# Patient Record
Sex: Female | Born: 1955
Health system: Southern US, Community
[De-identification: ages and names within clinical notes are randomized; demographics above are authoritative.]

## PROBLEM LIST (undated history)

## (undated) DIAGNOSIS — M8580 Other specified disorders of bone density and structure, unspecified site: Secondary | ICD-10-CM

## (undated) DIAGNOSIS — E785 Hyperlipidemia, unspecified: Secondary | ICD-10-CM

## (undated) DIAGNOSIS — C50919 Malignant neoplasm of unspecified site of unspecified female breast: Secondary | ICD-10-CM

## (undated) DIAGNOSIS — E119 Type 2 diabetes mellitus without complications: Secondary | ICD-10-CM

## (undated) DIAGNOSIS — I10 Essential (primary) hypertension: Secondary | ICD-10-CM

## (undated) DIAGNOSIS — K219 Gastro-esophageal reflux disease without esophagitis: Secondary | ICD-10-CM

## (undated) HISTORY — DX: Gastro-esophageal reflux disease without esophagitis: K21.9

## (undated) HISTORY — DX: Other specified disorders of bone density and structure, unspecified site: M85.80

## (undated) HISTORY — DX: Malignant neoplasm of unspecified site of unspecified female breast: C50.919

## (undated) HISTORY — DX: Type 2 diabetes mellitus without complications: E11.9

## (undated) HISTORY — DX: Hyperlipidemia, unspecified: E78.5

## (undated) HISTORY — DX: Essential (primary) hypertension: I10

## (undated) HISTORY — PX: BREAST LUMPECTOMY WITH AXILLARY LYMPH NODE BIOPSY: SHX5593

---

## 1998-05-04 ENCOUNTER — Other Ambulatory Visit: Admission: RE | Admit: 1998-05-04 | Discharge: 1998-05-04 | Payer: Self-pay | Admitting: Obstetrics and Gynecology

## 1998-08-22 HISTORY — PX: DILATION AND CURETTAGE OF UTERUS: SHX78

## 1998-08-24 ENCOUNTER — Ambulatory Visit (HOSPITAL_COMMUNITY): Admission: RE | Admit: 1998-08-24 | Discharge: 1998-08-24 | Payer: Self-pay | Admitting: Obstetrics and Gynecology

## 1998-11-21 HISTORY — PX: ABDOMINAL HYSTERECTOMY: SHX81

## 1998-12-03 ENCOUNTER — Inpatient Hospital Stay (HOSPITAL_COMMUNITY): Admission: RE | Admit: 1998-12-03 | Discharge: 1998-12-06 | Payer: Self-pay | Admitting: Obstetrics and Gynecology

## 2000-01-14 ENCOUNTER — Other Ambulatory Visit: Admission: RE | Admit: 2000-01-14 | Discharge: 2000-01-14 | Payer: Self-pay | Admitting: Obstetrics and Gynecology

## 2000-02-24 ENCOUNTER — Encounter: Admission: RE | Admit: 2000-02-24 | Discharge: 2000-05-24 | Payer: Self-pay | Admitting: General Practice

## 2000-08-05 ENCOUNTER — Encounter: Admission: RE | Admit: 2000-08-05 | Discharge: 2000-11-03 | Payer: Self-pay | Admitting: General Practice

## 2000-11-25 ENCOUNTER — Encounter: Admission: RE | Admit: 2000-11-25 | Discharge: 2001-02-23 | Payer: Self-pay | Admitting: General Practice

## 2001-02-05 ENCOUNTER — Other Ambulatory Visit: Admission: RE | Admit: 2001-02-05 | Discharge: 2001-02-05 | Payer: Self-pay | Admitting: Obstetrics and Gynecology

## 2002-03-08 ENCOUNTER — Other Ambulatory Visit: Admission: RE | Admit: 2002-03-08 | Discharge: 2002-03-08 | Payer: Self-pay | Admitting: Obstetrics and Gynecology

## 2003-04-19 ENCOUNTER — Other Ambulatory Visit: Admission: RE | Admit: 2003-04-19 | Discharge: 2003-04-19 | Payer: Self-pay | Admitting: Obstetrics and Gynecology

## 2008-10-22 DIAGNOSIS — C50919 Malignant neoplasm of unspecified site of unspecified female breast: Secondary | ICD-10-CM

## 2008-10-22 HISTORY — DX: Malignant neoplasm of unspecified site of unspecified female breast: C50.919

## 2008-11-03 ENCOUNTER — Encounter: Admission: RE | Admit: 2008-11-03 | Discharge: 2008-11-03 | Payer: Self-pay | Admitting: Obstetrics and Gynecology

## 2008-11-03 ENCOUNTER — Encounter (INDEPENDENT_AMBULATORY_CARE_PROVIDER_SITE_OTHER): Payer: Self-pay | Admitting: Diagnostic Radiology

## 2008-11-13 ENCOUNTER — Encounter: Admission: RE | Admit: 2008-11-13 | Discharge: 2008-11-13 | Payer: Self-pay | Admitting: Obstetrics and Gynecology

## 2008-12-11 ENCOUNTER — Encounter: Admission: RE | Admit: 2008-12-11 | Discharge: 2008-12-11 | Payer: Self-pay | Admitting: General Surgery

## 2008-12-12 ENCOUNTER — Encounter (INDEPENDENT_AMBULATORY_CARE_PROVIDER_SITE_OTHER): Payer: Self-pay | Admitting: General Surgery

## 2008-12-12 ENCOUNTER — Ambulatory Visit (HOSPITAL_BASED_OUTPATIENT_CLINIC_OR_DEPARTMENT_OTHER): Admission: RE | Admit: 2008-12-12 | Discharge: 2008-12-12 | Payer: Self-pay | Admitting: General Surgery

## 2008-12-12 ENCOUNTER — Encounter: Admission: RE | Admit: 2008-12-12 | Discharge: 2008-12-12 | Payer: Self-pay | Admitting: General Surgery

## 2009-01-12 ENCOUNTER — Encounter (INDEPENDENT_AMBULATORY_CARE_PROVIDER_SITE_OTHER): Payer: Self-pay | Admitting: General Surgery

## 2009-01-12 ENCOUNTER — Ambulatory Visit (HOSPITAL_BASED_OUTPATIENT_CLINIC_OR_DEPARTMENT_OTHER): Admission: RE | Admit: 2009-01-12 | Discharge: 2009-01-12 | Payer: Self-pay | Admitting: General Surgery

## 2009-01-24 ENCOUNTER — Ambulatory Visit: Payer: Self-pay | Admitting: Oncology

## 2009-02-05 ENCOUNTER — Encounter (INDEPENDENT_AMBULATORY_CARE_PROVIDER_SITE_OTHER): Payer: Self-pay | Admitting: *Deleted

## 2009-02-07 ENCOUNTER — Ambulatory Visit: Admission: RE | Admit: 2009-02-07 | Discharge: 2009-04-22 | Payer: Self-pay | Admitting: Radiation Oncology

## 2009-02-07 LAB — CBC WITH DIFFERENTIAL/PLATELET
EOS%: 1.7 % (ref 0.0–7.0)
Eosinophils Absolute: 0.1 10*3/uL (ref 0.0–0.5)
LYMPH%: 30.4 % (ref 14.0–48.0)
MCH: 28.4 pg (ref 26.0–34.0)
MCV: 84.4 fL (ref 81.0–101.0)
MONO%: 6.9 % (ref 0.0–13.0)
NEUT#: 3.5 10*3/uL (ref 1.5–6.5)
Platelets: 258 10*3/uL (ref 145–400)
RBC: 4.16 10*6/uL (ref 3.70–5.32)
RDW: 13.6 % (ref 11.3–14.5)

## 2009-02-08 LAB — COMPREHENSIVE METABOLIC PANEL
ALT: 26 U/L (ref 0–35)
AST: 19 U/L (ref 0–37)
Alkaline Phosphatase: 85 U/L (ref 39–117)
BUN: 24 mg/dL — ABNORMAL HIGH (ref 6–23)
Creatinine, Ser: 0.77 mg/dL (ref 0.40–1.20)

## 2009-02-08 LAB — VITAMIN D 25 HYDROXY (VIT D DEFICIENCY, FRACTURES): Vit D, 25-Hydroxy: 39 ng/mL (ref 30–89)

## 2009-02-23 ENCOUNTER — Ambulatory Visit (HOSPITAL_COMMUNITY): Admission: RE | Admit: 2009-02-23 | Discharge: 2009-02-23 | Payer: Self-pay | Admitting: Oncology

## 2009-04-18 ENCOUNTER — Ambulatory Visit: Payer: Self-pay | Admitting: Oncology

## 2009-04-20 LAB — CBC WITH DIFFERENTIAL/PLATELET
BASO%: 0.7 % (ref 0.0–2.0)
EOS%: 2.4 % (ref 0.0–7.0)
Eosinophils Absolute: 0.1 10*3/uL (ref 0.0–0.5)
LYMPH%: 27.4 % (ref 14.0–49.7)
MCHC: 33.5 g/dL (ref 31.5–36.0)
MCV: 84.4 fL (ref 79.5–101.0)
MONO%: 10.1 % (ref 0.0–14.0)
NEUT#: 2.2 10*3/uL (ref 1.5–6.5)
Platelets: 201 10*3/uL (ref 145–400)
RBC: 4.23 10*6/uL (ref 3.70–5.45)
RDW: 14.4 % (ref 11.2–14.5)

## 2009-04-20 LAB — COMPREHENSIVE METABOLIC PANEL
ALT: 42 U/L — ABNORMAL HIGH (ref 0–35)
AST: 26 U/L (ref 0–37)
Albumin: 4 g/dL (ref 3.5–5.2)
Alkaline Phosphatase: 73 U/L (ref 39–117)
Glucose, Bld: 224 mg/dL — ABNORMAL HIGH (ref 70–99)
Potassium: 4.5 mEq/L (ref 3.5–5.3)
Sodium: 140 mEq/L (ref 135–145)
Total Bilirubin: 0.4 mg/dL (ref 0.3–1.2)
Total Protein: 6.8 g/dL (ref 6.0–8.3)

## 2009-07-04 ENCOUNTER — Ambulatory Visit: Payer: Self-pay | Admitting: Oncology

## 2009-07-06 LAB — CBC WITH DIFFERENTIAL/PLATELET
BASO%: 0.8 % (ref 0.0–2.0)
Basophils Absolute: 0 10*3/uL (ref 0.0–0.1)
EOS%: 2.1 % (ref 0.0–7.0)
HCT: 35.5 % (ref 34.8–46.6)
HGB: 11.9 g/dL (ref 11.6–15.9)
LYMPH%: 32.4 % (ref 14.0–49.7)
MCH: 28.7 pg (ref 25.1–34.0)
MCHC: 33.5 g/dL (ref 31.5–36.0)
NEUT%: 55 % (ref 38.4–76.8)
Platelets: 217 10*3/uL (ref 145–400)
lymph#: 1.3 10*3/uL (ref 0.9–3.3)

## 2009-07-09 LAB — COMPREHENSIVE METABOLIC PANEL
ALT: 28 U/L (ref 0–35)
AST: 21 U/L (ref 0–37)
BUN: 16 mg/dL (ref 6–23)
CO2: 23 mEq/L (ref 19–32)
Calcium: 9.1 mg/dL (ref 8.4–10.5)
Chloride: 104 mEq/L (ref 96–112)
Creatinine, Ser: 0.67 mg/dL (ref 0.40–1.20)
Total Bilirubin: 0.5 mg/dL (ref 0.3–1.2)

## 2009-07-09 LAB — CANCER ANTIGEN 27.29: CA 27.29: 15 U/mL (ref 0–39)

## 2009-07-09 LAB — LACTATE DEHYDROGENASE: LDH: 160 U/L (ref 94–250)

## 2009-11-12 ENCOUNTER — Ambulatory Visit: Payer: Self-pay | Admitting: Oncology

## 2009-11-16 ENCOUNTER — Encounter: Admission: RE | Admit: 2009-11-16 | Discharge: 2009-11-16 | Payer: Self-pay | Admitting: Oncology

## 2009-11-16 LAB — CBC WITH DIFFERENTIAL/PLATELET
BASO%: 0.4 % (ref 0.0–2.0)
HCT: 35.2 % (ref 34.8–46.6)
LYMPH%: 31.6 % (ref 14.0–49.7)
MCH: 29.6 pg (ref 25.1–34.0)
MCHC: 33.8 g/dL (ref 31.5–36.0)
MONO#: 0.4 10*3/uL (ref 0.1–0.9)
NEUT%: 56.9 % (ref 38.4–76.8)
Platelets: 207 10*3/uL (ref 145–400)
WBC: 4.6 10*3/uL (ref 3.9–10.3)

## 2009-11-17 LAB — COMPREHENSIVE METABOLIC PANEL
ALT: 32 U/L (ref 0–35)
CO2: 22 mEq/L (ref 19–32)
Creatinine, Ser: 0.65 mg/dL (ref 0.40–1.20)
Total Bilirubin: 0.5 mg/dL (ref 0.3–1.2)

## 2010-01-03 ENCOUNTER — Telehealth: Payer: Self-pay | Admitting: Gastroenterology

## 2010-05-16 ENCOUNTER — Ambulatory Visit: Payer: Self-pay | Admitting: Oncology

## 2010-05-17 LAB — COMPREHENSIVE METABOLIC PANEL
ALT: 26 U/L (ref 0–35)
Albumin: 4 g/dL (ref 3.5–5.2)
Alkaline Phosphatase: 66 U/L (ref 39–117)
Glucose, Bld: 178 mg/dL — ABNORMAL HIGH (ref 70–99)
Potassium: 4.4 mEq/L (ref 3.5–5.3)
Sodium: 138 mEq/L (ref 135–145)
Total Protein: 6.6 g/dL (ref 6.0–8.3)

## 2010-05-17 LAB — CBC WITH DIFFERENTIAL/PLATELET
Basophils Absolute: 0.1 10*3/uL (ref 0.0–0.1)
EOS%: 2.5 % (ref 0.0–7.0)
Eosinophils Absolute: 0.1 10*3/uL (ref 0.0–0.5)
HGB: 11.7 g/dL (ref 11.6–15.9)
NEUT#: 2.2 10*3/uL (ref 1.5–6.5)
RDW: 13.4 % (ref 11.2–14.5)
lymph#: 1.7 10*3/uL (ref 0.9–3.3)

## 2010-05-17 LAB — CANCER ANTIGEN 27.29: CA 27.29: 6 U/mL (ref 0–39)

## 2010-05-22 ENCOUNTER — Encounter: Payer: Self-pay | Admitting: Gastroenterology

## 2010-11-27 ENCOUNTER — Ambulatory Visit: Payer: Self-pay | Admitting: Oncology

## 2010-11-29 ENCOUNTER — Encounter
Admission: RE | Admit: 2010-11-29 | Discharge: 2010-11-29 | Payer: Self-pay | Source: Home / Self Care | Attending: Oncology | Admitting: Oncology

## 2010-11-29 LAB — CBC WITH DIFFERENTIAL/PLATELET
Basophils Absolute: 0 10*3/uL (ref 0.0–0.1)
EOS%: 2 % (ref 0.0–7.0)
HCT: 35.6 % (ref 34.8–46.6)
HGB: 12.1 g/dL (ref 11.6–15.9)
LYMPH%: 31 % (ref 14.0–49.7)
MCH: 29.5 pg (ref 25.1–34.0)
MCV: 86.7 fL (ref 79.5–101.0)
MONO%: 7.3 % (ref 0.0–14.0)
NEUT%: 59.1 % (ref 38.4–76.8)
Platelets: 227 10*3/uL (ref 145–400)
RDW: 13.5 % (ref 11.2–14.5)

## 2010-11-30 LAB — COMPREHENSIVE METABOLIC PANEL
AST: 18 U/L (ref 0–37)
Albumin: 4.2 g/dL (ref 3.5–5.2)
Alkaline Phosphatase: 71 U/L (ref 39–117)
BUN: 17 mg/dL (ref 6–23)
Calcium: 9 mg/dL (ref 8.4–10.5)
Creatinine, Ser: 0.68 mg/dL (ref 0.40–1.20)
Glucose, Bld: 140 mg/dL — ABNORMAL HIGH (ref 70–99)
Total Bilirubin: 0.6 mg/dL (ref 0.3–1.2)

## 2010-11-30 LAB — LACTATE DEHYDROGENASE: LDH: 148 U/L (ref 94–250)

## 2010-12-13 ENCOUNTER — Encounter: Payer: Self-pay | Admitting: Gastroenterology

## 2011-01-23 NOTE — Progress Notes (Signed)
Summary: Schedule Colonoscopy  Phone Note Outgoing Call Call back at [facility] Phone [phone]   Call placed by: Harlow Mares CMA Duncan Dull),  January 03, 2010 8:37 AM Call placed to: Patient Summary of Call: Left message on patients machine to call back.  Initial call taken by: Harlow Mares CMA Duncan Dull),  January 03, 2010 8:38 AM  Follow-up for Phone Call        Left message on patients machine to call back.  Follow-up by: Harlow Mares CMA Duncan Dull),  January 11, 2010 9:16 AM  Additional Follow-up for Phone Call Additional follow up Details #1::        Left message on patients machine to call back. multiple attempts to contact patient without a return call back.  Additional Follow-up by: Harlow Mares CMA Duncan Dull),  January 17, 2010 9:18 AM

## 2011-01-23 NOTE — Letter (Signed)
[facility]   Imported By: Wilder Glade 06/26/2010 16:18:47  _____________________________________________________________________  External Attachment:    Type:   Image     Comment:   External Document

## 2011-02-12 NOTE — Letter (Signed)
Summary: [facility]  [facility]   Imported By: Sherian Rein 02/06/2011 15:01:46  _____________________________________________________________________  External Attachment:    Type:   Image     Comment:   External Document

## 2011-04-07 LAB — BASIC METABOLIC PANEL
BUN: 14 mg/dL (ref 6–23)
CO2: 26 mEq/L (ref 19–32)
Calcium: 9 mg/dL (ref 8.4–10.5)
Creatinine, Ser: 0.58 mg/dL (ref 0.4–1.2)
Glucose, Bld: 155 mg/dL — ABNORMAL HIGH (ref 70–99)
Sodium: 137 mEq/L (ref 135–145)

## 2011-05-06 NOTE — Op Note (Signed)
Mary [name], Mary [name]               ACCOUNT NO.:  [id number]   MEDICAL RECORD NO.:  [id number]          PATIENT TYPE:  AMB   LOCATION:  DSC                          FACILITY:  [facility]   PHYSICIAN:  Ollen Gross. Vernell Morgans, M.D. DATE OF BIRTH:  Nov 28, 1956   DATE OF PROCEDURE:  01/12/2009  DATE OF DISCHARGE:  01/12/2009                               OPERATIVE REPORT   PREOPERATIVE DIAGNOSIS:  Left breast cancer.   POSTOPERATIVE DIAGNOSIS:  Left breast cancer.   PROCEDURE:  Left left sentinel lymph node biopsy with injection of blue  dye.   SURGEON:  Ollen Gross. Vernell Morgans, MD   ANESTHESIA:  General.   PROCEDURE:  After informed consent was obtained, the patient was brought  to the operating and placed in the supine position on the operating  table.  After adequate induction of general anesthesia, the patient's  left breast and axilla were prepped with Betadine and draped in the  usual sterile manner.  Earlier in the day, the patient had undergone  injection of 1 mCi of technetium sulfur colloid in the subareolar  position.  At this point, we also injected 2 mL of methylene blue and 3  mL of injectable saline also in the subareolar position.  The breast was  massaged for about 5 minutes using the NeoProbe.  A hot spot in the left  axilla was identified.  A small transverse incision was made with a 15  blade knife overlying this hot spot.  This incision was carried down  through the skin and subcutaneous tissue sharply with electrocautery  until the axilla was entered using the NeoProbe to direct dissection.  Blunt hemostat dissection was carried out until we were able to identify  3 hot blue lymph nodes.  The ex vivo count on the first was  approximately 1200.  The second and third ex vivo counts were around 300-  400.  Each of these was dissected out by sharp Bovie dissection.  Each  was sent for touch preps which were negative for any tumor cells.  Once  this was accomplished, the wound was  examined and found to be  hemostatic.  No other hot spots, palpable lymph nodes, or blue lymph  nodes were identified.  At this point, the deep layer of the wound was  closed with interrupted 3-0 Vicryl stitches and the skin was closed with  a running 4-0 Monocryl subcuticular stitch.  Dermabond dressing was  applied.  The patient tolerated the procedure well.  At the end of the  case, all needle, sponge, and instrument counts were correct.  The  patient was then awoke and taken to the recovery room in stable  condition.      Ollen Gross. Vernell Morgans, M.D.  Electronically Signed     PST/MEDQ  D:  01/15/2009  T:  01/15/2009  Job:  [id number]

## 2011-05-06 NOTE — Op Note (Signed)
Mary [name], Mary [name]               ACCOUNT NO.:  [id number]   MEDICAL RECORD NO.:  [id number]          PATIENT TYPE:  AMB   LOCATION:  DSC                          FACILITY:  [facility]   PHYSICIAN:  Ollen Gross. Vernell Morgans, M.D. DATE OF BIRTH:  August 05, 1956   DATE OF PROCEDURE:  12/12/2008  DATE OF DISCHARGE:                               OPERATIVE REPORT   PREOPERATIVE DIAGNOSIS:  Left breast ductal carcinoma in situ.   POSTOPERATIVE DIAGNOSIS:  Left breast ductal carcinoma in situ.   PROCEDURE:  Left breast needle-localized lumpectomy.   SURGEON:  Ollen Gross. Vernell Morgans, MD   ANESTHESIA:  General via LMA.   PROCEDURE:  After informed consent was obtained, the patient was brought  to the operating room, placed in supine position on the operating table.  After adequate induction of general anesthesia, the patient's left  breast was prepped with Betadine and draped in usual sterile manner.  Earlier in the day, the patient had undergone a wire localization  procedure and the patient had 2 wires entering the left breast in the 6  o'clock position.  The calcifications and area of DCIS were very shallow  and small and these wires bracketed the area.  A small radial incision  was made between the 2 wires with a #15 blade knife.  This incision was  carried down through the skin and then to the subcutaneous tissue  sharply with the electrocautery.  At this point, the skin and  subcutaneous tissue was undermined circumferentially beyond the entry  point of the wires.  A circular portion of breast tissue was then taken  out deep to the entry site of the wires to incorporate the bracketed  area.  This was all done sharply with the electrocautery.  Once the  specimen was removed, it was marked with the paint kit according to the  assigned colors, and then a specimen radiograph of the specimen showed  the calcifications and clipped to be in the center of the specimen.  It  was then sent to pathology for  further evaluation.  Hemostasis was  achieved using the Bovie electrocautery.  The wound was irrigated with  copious amounts of saline.  The wound was infiltrated with 0.25%  Marcaine.  The incision was then closed with a running 4-0 Monocryl  subcuticular stitch and a Dermabond dressing was applied.  The patient  tolerated the procedure well.  At the end of the case, all needle,  sponge, and instrument counts were correct.  The patient was then  awakened, taken to recovery room in stable condition.      Ollen Gross. Vernell Morgans, M.D.  Electronically Signed     PST/MEDQ  D:  12/12/2008  T:  12/13/2008  Job:  [id number]

## 2011-06-06 ENCOUNTER — Other Ambulatory Visit: Payer: Self-pay | Admitting: Oncology

## 2011-06-06 ENCOUNTER — Encounter (HOSPITAL_BASED_OUTPATIENT_CLINIC_OR_DEPARTMENT_OTHER): Payer: 59 | Admitting: Oncology

## 2011-06-06 DIAGNOSIS — C50519 Malignant neoplasm of lower-outer quadrant of unspecified female breast: Secondary | ICD-10-CM

## 2011-06-06 LAB — COMPREHENSIVE METABOLIC PANEL
ALT: 22 U/L (ref 0–35)
AST: 19 U/L (ref 0–37)
Albumin: 4.1 g/dL (ref 3.5–5.2)
Alkaline Phosphatase: 65 U/L (ref 39–117)
Potassium: 4.6 mEq/L (ref 3.5–5.3)
Sodium: 142 mEq/L (ref 135–145)
Total Protein: 6.7 g/dL (ref 6.0–8.3)

## 2011-06-06 LAB — CBC WITH DIFFERENTIAL/PLATELET
BASO%: 0.7 % (ref 0.0–2.0)
HCT: 33.7 % — ABNORMAL LOW (ref 34.8–46.6)
LYMPH%: 33.9 % (ref 14.0–49.7)
MCHC: 32.7 g/dL (ref 31.5–36.0)
MONO#: 0.3 10*3/uL (ref 0.1–0.9)
NEUT%: 55.6 % (ref 38.4–76.8)
Platelets: 211 10*3/uL (ref 145–400)
WBC: 4.1 10*3/uL (ref 3.9–10.3)

## 2011-06-13 ENCOUNTER — Other Ambulatory Visit: Payer: Self-pay | Admitting: Oncology

## 2011-06-13 ENCOUNTER — Encounter (HOSPITAL_BASED_OUTPATIENT_CLINIC_OR_DEPARTMENT_OTHER): Payer: 59 | Admitting: Oncology

## 2011-06-13 DIAGNOSIS — Z9889 Other specified postprocedural states: Secondary | ICD-10-CM

## 2011-09-26 LAB — COMPREHENSIVE METABOLIC PANEL
AST: 23 U/L (ref 0–37)
BUN: 15 mg/dL (ref 6–23)
CO2: 27 mEq/L (ref 19–32)
Calcium: 9.1 mg/dL (ref 8.4–10.5)
Chloride: 105 mEq/L (ref 96–112)
Creatinine, Ser: 0.55 mg/dL (ref 0.4–1.2)
GFR calc non Af Amer: >60 mL/min (ref >60)
Glucose, Bld: 141 mg/dL — ABNORMAL HIGH (ref 70–99)
Total Bilirubin: 1 mg/dL (ref 0.3–1.2)

## 2011-09-26 LAB — CBC
HCT: 36.7 % (ref 36.0–46.0)
Hemoglobin: 12.1 g/dL (ref 12.0–15.0)
MCV: 86.3 fL (ref 78.0–100.0)
RBC: 4.25 MIL/uL (ref 3.87–5.11)
WBC: 4.9 10*3/uL (ref 4.0–10.5)

## 2011-09-26 LAB — DIFFERENTIAL
Basophils Absolute: 0 10*3/uL (ref 0.0–0.1)
Basophils Relative: 1 % (ref 0–1)
Eosinophils Absolute: 0.1 10*3/uL (ref 0.0–0.7)
Eosinophils Relative: 3 % (ref 0–5)
Lymphocytes Relative: 40 % (ref 12–46)
Lymphs Abs: 2 10*3/uL (ref 0.7–4.0)
Monocytes Absolute: 0.4 10*3/uL (ref 0.1–1.0)
Monocytes Relative: 9 % (ref 3–12)
Neutro Abs: 2.4 10*3/uL (ref 1.7–7.7)
Neutrophils Relative %: 48 % (ref 43–77)

## 2011-09-26 LAB — GLUCOSE, CAPILLARY
Glucose-Capillary: 112 mg/dL — ABNORMAL HIGH (ref 70–99)
Glucose-Capillary: 147 mg/dL — ABNORMAL HIGH (ref 70–99)

## 2011-11-21 ENCOUNTER — Telehealth: Payer: Self-pay | Admitting: Oncology

## 2011-11-21 NOTE — Telephone Encounter (Signed)
pt called and scheduled her appts for [id number]

## 2011-12-05 ENCOUNTER — Ambulatory Visit
Admission: RE | Admit: 2011-12-05 | Discharge: 2011-12-05 | Disposition: A | Discharge status: Home / Self Care | Payer: 59 | Source: Ambulatory Visit | Attending: Oncology | Admitting: Oncology

## 2011-12-05 DIAGNOSIS — Z9889 Other specified postprocedural states: Secondary | ICD-10-CM

## 2012-05-24 ENCOUNTER — Telehealth: Payer: Self-pay | Admitting: Oncology

## 2012-05-24 NOTE — Telephone Encounter (Signed)
Changed 6/7 lab appt time to 9 am due to lb meeting. lmonvm informing pt. Pt asked to call me back if she has any questions or cannot keep this d/t.

## 2012-05-24 NOTE — Telephone Encounter (Signed)
Pt called and changed time for 6/7 lb appt to 11:15 am. Also confirmed 6/14 appt.

## 2012-05-27 ENCOUNTER — Other Ambulatory Visit: Payer: Self-pay | Admitting: *Deleted

## 2012-05-27 DIAGNOSIS — M810 Age-related osteoporosis without current pathological fracture: Secondary | ICD-10-CM

## 2012-05-27 DIAGNOSIS — C50919 Malignant neoplasm of unspecified site of unspecified female breast: Secondary | ICD-10-CM

## 2012-05-28 ENCOUNTER — Other Ambulatory Visit: Payer: Self-pay | Admitting: Oncology

## 2012-05-28 ENCOUNTER — Other Ambulatory Visit: Payer: 59 | Admitting: Lab

## 2012-05-28 ENCOUNTER — Other Ambulatory Visit (HOSPITAL_BASED_OUTPATIENT_CLINIC_OR_DEPARTMENT_OTHER): Payer: 59 | Admitting: Lab

## 2012-05-28 DIAGNOSIS — C50919 Malignant neoplasm of unspecified site of unspecified female breast: Secondary | ICD-10-CM

## 2012-05-28 DIAGNOSIS — M810 Age-related osteoporosis without current pathological fracture: Secondary | ICD-10-CM

## 2012-05-28 DIAGNOSIS — E559 Vitamin D deficiency, unspecified: Secondary | ICD-10-CM

## 2012-05-28 LAB — CBC WITH DIFFERENTIAL/PLATELET
EOS%: 2 % (ref 0.0–7.0)
Eosinophils Absolute: 0.1 10*3/uL (ref 0.0–0.5)
MCV: 88.2 fL (ref 79.5–101.0)
MONO%: 7.2 % (ref 0.0–14.0)
NEUT#: 2.9 10*3/uL (ref 1.5–6.5)
RBC: 4.05 10*6/uL (ref 3.70–5.45)
RDW: 13.4 % (ref 11.2–14.5)
lymph#: 1.5 10*3/uL (ref 0.9–3.3)

## 2012-05-29 LAB — COMPREHENSIVE METABOLIC PANEL
AST: 17 U/L (ref 0–37)
Albumin: 4.1 g/dL (ref 3.5–5.2)
Alkaline Phosphatase: 65 U/L (ref 39–117)
Calcium: 9.2 mg/dL (ref 8.4–10.5)
Chloride: 102 mEq/L (ref 96–112)
Potassium: 4.5 mEq/L (ref 3.5–5.3)
Sodium: 138 mEq/L (ref 135–145)
Total Protein: 6.5 g/dL (ref 6.0–8.3)

## 2012-06-04 ENCOUNTER — Telehealth: Payer: Self-pay | Admitting: Oncology

## 2012-06-04 ENCOUNTER — Other Ambulatory Visit: Payer: Self-pay | Admitting: Oncology

## 2012-06-04 ENCOUNTER — Ambulatory Visit (HOSPITAL_BASED_OUTPATIENT_CLINIC_OR_DEPARTMENT_OTHER): Payer: 59 | Admitting: Oncology

## 2012-06-04 VITALS — BP 105/72 | HR 111 | Temp 98.7°F | Ht 61.5 in | Wt 244.8 lb

## 2012-06-04 DIAGNOSIS — E559 Vitamin D deficiency, unspecified: Secondary | ICD-10-CM

## 2012-06-04 DIAGNOSIS — C50919 Malignant neoplasm of unspecified site of unspecified female breast: Secondary | ICD-10-CM | POA: Insufficient documentation

## 2012-06-04 DIAGNOSIS — Z170 Estrogen receptor positive status [ER+]: Secondary | ICD-10-CM

## 2012-06-04 NOTE — Telephone Encounter (Signed)
called pt and scheduled mammogram on 12/16 @ [facility].  scheduled f/u for 12/27 with lab on 12/20

## 2012-06-04 NOTE — Progress Notes (Signed)
ID: Mary [name]  DOB: 02-16-1956  MR#: [id number]  CSN#: [id number]   Interval History:   56 year old woman with history of T1 N0 ER/PR positive breast cancer status post lumpectomy, 11/23/2008, status post radiation therapy completed 03/30/2009 currently on Femara History of type 2 diabetes  ROS:  Return for followup. Doing well. We laid off from her job and has been unemployed for close to a year. She is otherwise doing well. She has joined a gym. She notes that her diabetic control is improved. She had a mammogram in December as well as a bone density test. The mammogram was normal. The bone density test showed mild osteopenia. Allergies  Allergen Reactions  . Codeine Nausea And Vomiting    Current Outpatient Prescriptions  Medication Sig Dispense Refill  . aspirin 81 MG tablet Take 81 mg by mouth daily.      Marland Kitchen atorvastatin (LIPITOR) 10 MG tablet Take 10 mg by mouth daily.      . Calcium-Magnesium-Vitamin D (CITRACAL CALCIUM+D PO) Take 2 tablets by mouth daily. calcim 630/vitamin d 500      . Cholecalciferol (VITAMIN D3) 2000 UNITS TABS Take 1 tablet by mouth daily.      Marland Kitchen esomeprazole (NEXIUM) 40 MG capsule Take 40 mg by mouth daily before breakfast.      . ferrous sulfate 325 (65 FE) MG tablet Take 325 mg by mouth daily with breakfast.      . glimepiride (AMARYL) 4 MG tablet Take 4 mg by mouth daily before breakfast.      . letrozole (FEMARA) 2.5 MG tablet Take 2.5 mg by mouth daily.      . metFORMIN (GLUCOPHAGE) 500 MG tablet Take 1,000 mg by mouth 2 (two) times daily with a meal.      . Multiple Vitamin (MULTIVITAMIN) tablet Take 1 tablet by mouth daily.      . pioglitazone (ACTOS) 30 MG tablet Take 30 mg by mouth daily.      Marland Kitchen telmisartan-hydrochlorothiazide (MICARDIS HCT) 40-12.5 MG per tablet Take 1 tablet by mouth daily.         Objective:  Filed Vitals:   06/04/12 0852  BP: 105/72  Pulse: 111  Temp: 98.7 F (37.1 C)    BMI: Body mass index is 45.51 kg/(m^2).   ECOG  FS:0  Physical Exam:   Sclerae unicteric  Oropharynx clear  No peripheral adenopathy  Lungs clear -- no rales or rhonchi  Heart regular rate and rhythm  Abdomen benign  MSK no focal spinal tenderness, no peripheral edema  Neuro nonfocal  Breast exam: Both breasts are free of any masses. The left breast is status post lumpectomy and radiation. There is tells ectasia present secondary to radiation. Both axilla negative for adenopathy.  Lab Results:      Chemistry      Component Value Date/Time   NA 138 05/28/2012 1052   K 4.5 05/28/2012 1052   CL 102 05/28/2012 1052   CO2 27 05/28/2012 1052   BUN 16 05/28/2012 1052   CREATININE 0.76 05/28/2012 1052      Component Value Date/Time   CALCIUM 9.2 05/28/2012 1052   ALKPHOS 65 05/28/2012 1052   AST 17 05/28/2012 1052   ALT 19 05/28/2012 1052   BILITOT 0.5 05/28/2012 1052       Lab Results  Component Value Date   WBC 4.9 05/28/2012   HGB 11.8 05/28/2012   HCT 35.8 05/28/2012   MCV 88.2 05/28/2012   PLT 249 05/28/2012  NEUTROABS 2.9 05/28/2012    Studies/Results:  No results found.  Assessment: Node-negative ER/PR positive breast cancer status post lumpectomy radiation on letrozole     Plan: Patient is doing well. She is currently is weight. I discussed her bone density test which showed mild osteopenia. She prefers to increase your vitamin D intake for now. I will see her in 6 months time with appropriate imaging studies.        Mary [name] 06/04/2012

## 2012-07-06 ENCOUNTER — Encounter: Payer: Self-pay | Admitting: Internal Medicine

## 2012-07-12 ENCOUNTER — Other Ambulatory Visit: Payer: Self-pay | Admitting: *Deleted

## 2012-07-12 DIAGNOSIS — E559 Vitamin D deficiency, unspecified: Secondary | ICD-10-CM

## 2012-07-12 DIAGNOSIS — C50919 Malignant neoplasm of unspecified site of unspecified female breast: Secondary | ICD-10-CM

## 2012-07-12 MED ORDER — LETROZOLE 2.5 MG PO TABS: 2.5000 mg | ORAL_TABLET | Freq: Every day | ORAL | Status: DC

## 2012-12-06 ENCOUNTER — Ambulatory Visit
Admission: RE | Admit: 2012-12-06 | Discharge: 2012-12-06 | Disposition: A | Discharge status: Home / Self Care | Payer: 59 | Source: Ambulatory Visit | Attending: Oncology | Admitting: Oncology

## 2012-12-06 DIAGNOSIS — C50919 Malignant neoplasm of unspecified site of unspecified female breast: Secondary | ICD-10-CM

## 2012-12-10 ENCOUNTER — Other Ambulatory Visit (HOSPITAL_BASED_OUTPATIENT_CLINIC_OR_DEPARTMENT_OTHER): Payer: 59 | Admitting: Lab

## 2012-12-10 DIAGNOSIS — E559 Vitamin D deficiency, unspecified: Secondary | ICD-10-CM

## 2012-12-10 DIAGNOSIS — C50919 Malignant neoplasm of unspecified site of unspecified female breast: Secondary | ICD-10-CM

## 2012-12-10 LAB — COMPREHENSIVE METABOLIC PANEL (CC13)
AST: 24 U/L (ref 5–34)
Alkaline Phosphatase: 66 U/L (ref 40–150)
BUN: 19 mg/dL (ref 7.0–26.0)
Calcium: 9.4 mg/dL (ref 8.4–10.4)
Chloride: 103 mEq/L (ref 98–107)
Creatinine: 0.8 mg/dL (ref 0.6–1.1)
Total Bilirubin: 0.67 mg/dL (ref 0.20–1.20)

## 2012-12-10 LAB — CBC WITH DIFFERENTIAL/PLATELET
BASO%: 1.3 % (ref 0.0–2.0)
EOS%: 2.4 % (ref 0.0–7.0)
HCT: 36.4 % (ref 34.8–46.6)
LYMPH%: 33.8 % (ref 14.0–49.7)
MCH: 29.4 pg (ref 25.1–34.0)
MCHC: 33.4 g/dL (ref 31.5–36.0)
MCV: 87.9 fL (ref 79.5–101.0)
MONO%: 9.5 % (ref 0.0–14.0)
NEUT%: 53 % (ref 38.4–76.8)
Platelets: 223 10*3/uL (ref 145–400)

## 2012-12-11 ENCOUNTER — Telehealth: Payer: Self-pay | Admitting: *Deleted

## 2012-12-11 NOTE — Telephone Encounter (Signed)
patient confirmed over the phone the new date and time on 12-13-2012 at 4:00pm

## 2012-12-13 ENCOUNTER — Ambulatory Visit (HOSPITAL_BASED_OUTPATIENT_CLINIC_OR_DEPARTMENT_OTHER): Payer: 59 | Admitting: Oncology

## 2012-12-13 VITALS — BP 110/73 | HR 128 | Temp 98.1°F | Resp 20 | Ht 61.5 in | Wt 239.3 lb

## 2012-12-13 DIAGNOSIS — Z170 Estrogen receptor positive status [ER+]: Secondary | ICD-10-CM

## 2012-12-13 DIAGNOSIS — C50919 Malignant neoplasm of unspecified site of unspecified female breast: Secondary | ICD-10-CM

## 2012-12-13 DIAGNOSIS — M899 Disorder of bone, unspecified: Secondary | ICD-10-CM

## 2012-12-13 NOTE — Progress Notes (Signed)
ID: Mary [name]  DOB: Jun 05, 1956  MR#: [id number]  CSN#: [id number]   Interval History:   55 year old woman with history of T1 N0 ER/PR positive breast cancer status post lumpectomy, 11/23/2008, status post radiation therapy completed 03/30/2009 currently on Femara History of type 2 diabetes; recent A1C was ~7, she has lost 5 lbs.   ROS:  Return for followup. Doing well. We laid off from her job and has been unemployed for close to a year. She is otherwise doing well. She has joined a gym. She notes that her diabetic control is improved. She had a mammogram in December as well as a bone density test. The mammogram was normal. The bone density test showed mild osteopenia.  Allergies  Allergen Reactions  . Codeine Nausea And Vomiting    Current Outpatient Prescriptions  Medication Sig Dispense Refill  . aspirin 81 MG tablet Take 81 mg by mouth daily.      Marland Kitchen atorvastatin (LIPITOR) 10 MG tablet Take 10 mg by mouth daily.      . Calcium-Magnesium-Vitamin D (CITRACAL CALCIUM+D PO) Take 2 tablets by mouth daily. calcim 630/vitamin d 500      . Cholecalciferol (VITAMIN D3) 2000 UNITS TABS Take 1 tablet by mouth daily.      Marland Kitchen esomeprazole (NEXIUM) 40 MG capsule Take 40 mg by mouth daily before breakfast.      . ferrous sulfate 325 (65 FE) MG tablet Take 325 mg by mouth daily with breakfast.      . glimepiride (AMARYL) 4 MG tablet Take 4 mg by mouth daily before breakfast.      . letrozole (FEMARA) 2.5 MG tablet Take 1 tablet (2.5 mg total) by mouth daily.  30 tablet  5  . metFORMIN (GLUCOPHAGE) 500 MG tablet Take 1,000 mg by mouth 2 (two) times daily with a meal.      . Multiple Vitamin (MULTIVITAMIN) tablet Take 1 tablet by mouth daily.      . pioglitazone (ACTOS) 30 MG tablet Take 30 mg by mouth daily.      Marland Kitchen telmisartan-hydrochlorothiazide (MICARDIS HCT) 40-12.5 MG per tablet Take 1 tablet by mouth daily.         Objective:  Filed Vitals:   12/13/12 1557  BP: 110/73  Pulse: 128  Temp:  98.1 F (36.7 C)  Resp: 20    BMI: Body mass index is 44.48 kg/(m^2).   ECOG FS:0  Physical Exam: Obese woman  Sclerae unicteric  Oropharynx clear  No peripheral adenopathy  Lungs clear -- no rales or rhonchi  Heart regular rate and rhythm  Abdomen benign  MSK no focal spinal tenderness, no peripheral edema  Neuro nonfocal  Breast exam: Both breasts are free of any masses. The left breast is status post lumpectomy and radiation. There is tells ectasia present secondary to radiation. Both axilla negative for adenopathy.  Lab Results:      Chemistry      Component Value Date/Time   NA 140 12/10/2012 0754   NA 138 05/28/2012 1052   K 4.1 12/10/2012 0754   K 4.5 05/28/2012 1052   CL 103 12/10/2012 0754   CL 102 05/28/2012 1052   CO2 26 12/10/2012 0754   CO2 27 05/28/2012 1052   BUN 19.0 12/10/2012 0754   BUN 16 05/28/2012 1052   CREATININE 0.8 12/10/2012 0754   CREATININE 0.76 05/28/2012 1052      Component Value Date/Time   CALCIUM 9.4 12/10/2012 0754   CALCIUM 9.2 05/28/2012 1052  ALKPHOS 66 12/10/2012 0754   ALKPHOS 65 05/28/2012 1052   AST 24 12/10/2012 0754   AST 17 05/28/2012 1052   ALT 29 12/10/2012 0754   ALT 19 05/28/2012 1052   BILITOT 0.67 12/10/2012 0754   BILITOT 0.5 05/28/2012 1052       Lab Results  Component Value Date   WBC 4.4 12/10/2012   HGB 12.2 12/10/2012   HCT 36.4 12/10/2012   MCV 87.9 12/10/2012   PLT 223 12/10/2012   NEUTROABS 2.3 12/10/2012    Studies/Results:  No results found.  Assessment:  Node-negative ER/PR positive breast cancer status post lumpectomy /radiation on letrozole , f/u 6 months.   Plan: Patient is doing well. She continues to lose weight,     Mary [name] 12/13/2012

## 2012-12-14 ENCOUNTER — Telehealth: Payer: Self-pay | Admitting: *Deleted

## 2012-12-14 NOTE — Telephone Encounter (Signed)
Gave patient instructions on getting her 2014 calendar

## 2012-12-17 ENCOUNTER — Ambulatory Visit: Payer: 59 | Admitting: Oncology

## 2012-12-17 ENCOUNTER — Other Ambulatory Visit: Payer: 59 | Admitting: Lab

## 2013-01-06 ENCOUNTER — Other Ambulatory Visit: Payer: Self-pay | Admitting: *Deleted

## 2013-01-06 DIAGNOSIS — E559 Vitamin D deficiency, unspecified: Secondary | ICD-10-CM

## 2013-01-06 DIAGNOSIS — C50919 Malignant neoplasm of unspecified site of unspecified female breast: Secondary | ICD-10-CM

## 2013-01-06 MED ORDER — LETROZOLE 2.5 MG PO TABS: 2.5000 mg | ORAL_TABLET | Freq: Every day | ORAL | Status: DC

## 2013-07-19 ENCOUNTER — Telehealth: Payer: Self-pay | Admitting: *Deleted

## 2013-07-19 NOTE — Telephone Encounter (Signed)
Left message for a return phone call to schedule patient with a new provider.  Awaiting patient response.

## 2013-07-22 ENCOUNTER — Encounter: Payer: Self-pay | Admitting: *Deleted

## 2013-07-22 NOTE — Progress Notes (Signed)
Mailed patient letter to contact our office to schedule an appt. With a new provider.

## 2013-07-25 ENCOUNTER — Telehealth: Payer: Self-pay | Admitting: *Deleted

## 2013-07-25 NOTE — Telephone Encounter (Signed)
Received a return phone call from patient and confirmed appt. For 08/08/13 at 10am with Bernell List.  Then will become Dr. Darnelle Catalan.

## 2013-08-08 ENCOUNTER — Encounter: Payer: Self-pay | Admitting: Family

## 2013-08-08 ENCOUNTER — Ambulatory Visit (HOSPITAL_BASED_OUTPATIENT_CLINIC_OR_DEPARTMENT_OTHER): Payer: 59 | Admitting: Lab

## 2013-08-08 ENCOUNTER — Ambulatory Visit (HOSPITAL_BASED_OUTPATIENT_CLINIC_OR_DEPARTMENT_OTHER): Payer: 59 | Admitting: Family

## 2013-08-08 ENCOUNTER — Telehealth: Payer: Self-pay | Admitting: *Deleted

## 2013-08-08 VITALS — BP 105/71 | HR 97 | Temp 98.1°F | Resp 18 | Ht 62.0 in | Wt 221.7 lb

## 2013-08-08 DIAGNOSIS — Z853 Personal history of malignant neoplasm of breast: Secondary | ICD-10-CM

## 2013-08-08 DIAGNOSIS — M899 Disorder of bone, unspecified: Secondary | ICD-10-CM

## 2013-08-08 DIAGNOSIS — Z170 Estrogen receptor positive status [ER+]: Secondary | ICD-10-CM

## 2013-08-08 DIAGNOSIS — C50919 Malignant neoplasm of unspecified site of unspecified female breast: Secondary | ICD-10-CM

## 2013-08-08 LAB — CBC WITH DIFFERENTIAL/PLATELET
Basophils Absolute: 0.1 10*3/uL (ref 0.0–0.1)
EOS%: 1.8 % (ref 0.0–7.0)
HCT: 36.1 % (ref 34.8–46.6)
HGB: 12 g/dL (ref 11.6–15.9)
LYMPH%: 32.6 % (ref 14.0–49.7)
MCH: 28.8 pg (ref 25.1–34.0)
NEUT%: 56.9 % (ref 38.4–76.8)
Platelets: 245 10*3/uL (ref 145–400)
lymph#: 2 10*3/uL (ref 0.9–3.3)

## 2013-08-08 LAB — COMPREHENSIVE METABOLIC PANEL (CC13)
AST: 18 U/L (ref 5–34)
BUN: 15.2 mg/dL (ref 7.0–26.0)
CO2: 27 mEq/L (ref 22–29)
Calcium: 9.7 mg/dL (ref 8.4–10.4)
Chloride: 104 mEq/L (ref 98–109)
Creatinine: 0.8 mg/dL (ref 0.6–1.1)
Total Bilirubin: 0.58 mg/dL (ref 0.20–1.20)

## 2013-08-08 NOTE — Telephone Encounter (Signed)
appts made and printed...td 

## 2013-08-08 NOTE — Progress Notes (Signed)
[facility]  Telephone:([phone] [phone]) [phone]  OFFICE PROGRESS NOTE   ID: Baneen Wieseler   DOB: [birth year]  MR#: [id number]  JYN#:[id number]   PCP: Garlan Fillers, MD GYN: Juluis Mire, M.D. QM:VHQI Vernell Morgans, M.D.,    RAD ONC: Margaretmary Dys, M.D.   HISTORY OF PRESENT ILLNESS: From Dr. Theron Arista Rubin's new patient evaluation note dated 02/07/2009: "This is a pleasant 57 year old woman from [place], referred by Dr. Carolynne Edouard for evaluation and treatment of breast cancer.This woman has been in relatively good health.  She has annual screening mammography.  Mammogram from October 25, 2008 at the [facility] showed calcification in the left breast.  Further images were recommended.  A followup mammogram showed calcifications and a tissue biopsy was recommended.  A biopsy performed on 11/03/2008 showed DCIS, calcifications and necrosis, ER and PR positive at 100% and 60% respectively.  A preoperative MRI scan was performed on 11/13/2008 and this showed a non-mass like rim enhancement in the left breast at 6 o'clock in biopsy cavity.  No other abnormalities were seen.  The patient elected to go on lumpectomy.  A lumpectomy performed on 12/12/2008 showed a focus of DCIS, high grade of calcifications and associated with a focus of invasive ductal cancer, grade 2/3 measuring 0.5 cm.  Lymphovascular invasion was not seen.  Surgical margins were clear.  The invasive component was ER and PR positive at 100% and 94% respectively, proliferative index 22%, and HER-2 was 0. Ms. Mohon has had an unremarkable postoperative course.  She has little bit tenderness in the left axilla."  Her subsequent history is as detailed below.   INTERVAL HISTORY: Dr. Darnelle Catalan and I saw Aslan Montagna today for followup of invasive ductal carcinoma of the left breast.  The patient was last seen by Dr. Donnie Coffin on 12/13/2012.  Since her last office visit, the patient has been doing relatively well.   She is establishing herself with Dr. Darrall Dears service today.  REVIEW OF SYSTEMS: A 10 point review of systems was completed and is negative except the patient states she has lost approximately 20 pounds through diet and exercise.  She denies any other symptomatology and states she feels great.   PAST MEDICAL HISTORY: Past Medical History  Diagnosis Date  . Breast cancer 10/2008    Left breast invasive ductal carcinoma  . Diabetes mellitus without complication   . Hypertension   . GERD (gastroesophageal reflux disease)   . Osteopenia   . Hyperlipidemia     PAST SURGICAL HISTORY: Past Surgical History  Procedure Laterality Date  . Breast lumpectomy with axillary lymph node biopsy Left     Lumpectomy in 11/2008 and axillary biopsy in 12/2008  . Abdominal hysterectomy  11/1998  . Dilation and curettage of uterus  08/1998  Includes hysterectomy with oophorectomy in 1999 status post D&C.   FAMILY HISTORY Family History  Problem Relation Age of Onset  . Heart attack Mother   . Cancer Father     Prostate cancer  . Hypertension Brother   Father is deceased from prostate cancer.  Mother is deceased from a heart attack.   She has no sisters. One brother is alive and well.   GYNECOLOGIC HISTORY: G0, P0, menarche age 27, menopause at the time of hysterectomy, hormone replacement therapy after hysterectomy.  Hormone replacement therapy was discontinued before her breast cancer diagnosis .  SOCIAL HISTORY: The patient was married for 15 years and was divorced in 19.  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  She currently works part-time at [employer] work.  In her spare time she enjoys cooking/baking, and walking her new dog Westly Pam.  ADVANCED DIRECTIVES: Not on file  HEALTH MAINTENANCE: History  Substance Use Topics  . Smoking status: Former Games developer  . Smokeless tobacco: Never Used     Comment: Quit smoking in 1990    . Alcohol Use: No    Colonoscopy:  Not on file PAP:  Not on file Bone density:  The patient's last bone density scan on 12/22/2011 showed a T score of -1.3 (osteopenia). Lipid panel:  Not on file  Allergies  Allergen Reactions  . Codeine Nausea And Vomiting    Current Outpatient Prescriptions  Medication Sig Dispense Refill  . aspirin 81 MG tablet Take 81 mg by mouth daily.      Marland Kitchen atorvastatin (LIPITOR) 10 MG tablet Take 10 mg by mouth daily.      . Calcium-Magnesium-Vitamin D (CITRACAL CALCIUM+D PO) Take 2 tablets by mouth daily. calcim 630/vitamin d 500      . Cholecalciferol (VITAMIN D3) 2000 UNITS TABS Take 1 tablet by mouth 2 (two) times daily.       Marland Kitchen esomeprazole (NEXIUM) 40 MG capsule Take 40 mg by mouth daily before breakfast.      . ferrous sulfate 325 (65 FE) MG tablet Take 325 mg by mouth daily with breakfast.      . glimepiride (AMARYL) 4 MG tablet Take 4 mg by mouth 2 (two) times daily.       Marland Kitchen letrozole (FEMARA) 2.5 MG tablet Take 1 tablet (2.5 mg total) by mouth daily.  30 tablet  6  . metFORMIN (GLUCOPHAGE) 500 MG tablet Take 1,000 mg by mouth 2 (two) times daily with a meal.      . Multiple Vitamin (MULTIVITAMIN) tablet Take 1 tablet by mouth daily.      . pioglitazone (ACTOS) 30 MG tablet Take 30 mg by mouth daily.      Marland Kitchen telmisartan-hydrochlorothiazide (MICARDIS HCT) 40-12.5 MG per tablet Take 1 tablet by mouth daily.       No current facility-administered medications for this visit.    OBJECTIVE: Filed Vitals:   08/08/13 0957  BP: 105/71  Pulse: 97  Temp: 98.1 F (36.7 C)  Resp: 18     Body mass index is 40.54 kg/(m^2).      ECOG FS: 0 - Asymptomatic  General appearance: Alert, cooperative, well nourished, no apparent distress Head: Normocephalic, without obvious abnormality, atraumatic Eyes: Arcus senilis, PERRLA, EOMI Nose: Nares, septum and mucosa are normal, no drainage or sinus tenderness Neck: Supple, symmetrical, trachea midline, excessive  habitus, no tenderness Resp: Clear to auscultation bilaterally Cardio: Regular rate and rhythm, S1, S2 normal, no murmur, click, rub or gallop Breasts: Pendulous bilaterally, left breast has well-healed surgical scars, left breast has surgical architectural  and radiation changes noted, glandular breast tissue bilaterally (left breast > right breast), bilateral axillary fullness GI: Soft, distended, non-tender, hypoactive bowel sounds, excessive habitus Extremities: Extremities normal, atraumatic, no cyanosis or edema Lymph nodes:Supraclavicular nodes normal Neurologic: Grossly normal   LAB RESULTS: Lab Results  Component Value Date   WBC 6.1 08/08/2013   NEUTROABS 3.5 08/08/2013   HGB 12.0 08/08/2013   HCT 36.1 08/08/2013   MCV 87.0 08/08/2013   PLT 245 08/08/2013      Chemistry      Component Value Date/Time   NA 141  08/08/2013 1120   NA 138 05/28/2012 1052   K 4.2 08/08/2013 1120   K 4.5 05/28/2012 1052   CL 103 12/10/2012 0754   CL 102 05/28/2012 1052   CO2 27 08/08/2013 1120   CO2 27 05/28/2012 1052   BUN 15.2 08/08/2013 1120   BUN 16 05/28/2012 1052   CREATININE 0.8 08/08/2013 1120   CREATININE 0.76 05/28/2012 1052      Component Value Date/Time   CALCIUM 9.7 08/08/2013 1120   CALCIUM 9.2 05/28/2012 1052   ALKPHOS 59 08/08/2013 1120   ALKPHOS 65 05/28/2012 1052   AST 18 08/08/2013 1120   AST 17 05/28/2012 1052   ALT 20 08/08/2013 1120   ALT 19 05/28/2012 1052   BILITOT 0.58 08/08/2013 1120   BILITOT 0.5 05/28/2012 1052      Lab Results  Component Value Date   LABCA2 10 05/28/2012    Urinalysis No results found for this basename: colorurine,  appearanceur,  labspec,  phurine,  glucoseu,  hgbur,  bilirubinur,  ketonesur,  proteinur,  urobilinogen,  nitrite,  leukocytesur    STUDIES: 1.  The patient's last bone density scan on 12/22/2011 showed a T score of -1.3 (osteopenia).  2.  The patient's last bilateral digital diagnostic mammogram on 12/06/2012 showed the breast tissue is almost  entirely fatty.  Left lumpectomy changes are present.  There are coarse benign dystrophic calcifications in the left lumpectomy site.  There is no suspicious dominant mass, nonsurgical architectural distortion or calcification to suggest malignancy.  No mammographic evidence of malignancy.   ASSESSMENT: 57 y.o. [place], [place] woman: 1.  The patient had a left breast needle core biopsy at the 6 o'clock position on 11/03/2008 which showed ductal carcinoma in situ with calcifications and necrosis, estrogen receptor 100% positive, progesterone receptor 60% positive.  2.  The patient had a bilateral breast MRI on 11/13/2008 which showed non mass-like rim enhancement of the left breast 6 o'clock biopsy cavity in the area of known DCIS.  The extent of the involvement of DCIS is best approximated by mammographically evident calcifications; bracketing could be considered at the time of preoperative needle localization. No other area of abnormal enhancement was seen in either breast.  No lymphadenopathy or other abnormal T2-weighted hyperintensity was seen.  No MRI evidence for multifocal or multicentric disease (clinical stage 0, PTis).  3.  Status post left breast lumpectomy on 12/12/2008 for a stage I, pT1a, pNX, pMX, 0.5 cm invasive ductal carcinoma, grade 2, and high-grade ductal carcinoma in situ with calcifications and necrosis, the margins were negative for carcinoma, estrogen receptor 100% positive, progesterone receptor 94% positive, Ki-67 22%, HER-2/neu by CISH no amplification with a ratio of 1.10.  4.  Status post left axillary sentinel node biopsy on 01/12/2009 which showed 0/3 metastatic left axillary lymph nodes.  5.  The patient underwent radiation therapy from 02/19/2009 through 03/30/2009.    6.  The patient started antiestrogen therapy with Femara in 03/2009.   7.  Osteopenia   PLAN: Ms. Dulany and will continue antiestrogen therapy with Letrozole 2.5 mg by mouth daily.   Antiestrogen therapy planned to continue until 03/2014, at which time she will have completed 5 years of antiestrogen therapy.  We will schedule her annual bilateral digital diagnostic mammogram for her.  Ms. Fiero was encouraged to continue exercising daily in addition to taking calcium with vitamin D and vitamin D3 2000 IUs by mouth daily for osteopenia.  The patient states that her GYN Dr. Arelia Sneddon  completes her bone density scans.  Let her know that she is due for a bone density scan in 11/2013 and asked her to please have the results forwarded to our office.  We plan to see Ms. Needles again in 03/2014 at which time we will check laboratories of CBC, CMP, LDH, and vitamin D level.  All questions were answered.  The patient was encouraged to contact us in the interim with any problems, questions or concerns.   Larina Bras, NP-C 08/08/2013, 6:01 PM   ADDENDUM: This 57 year old [place] woman established herself in my practice today. We reviewed her diagnosis, treatment history, and prognosis. In brief:  She underwent left lumpectomy without sentinel lymph node sampling December of 2009 for a pT1a pNX, stage IA invasive ductal carcinoma, grade 2, estrogen and progesterone receptor positive, with an MIB-1 of 22%, and no HER-2 amplification. Left sentinel lymph node biopsy January of 2010 showed 0 of 3 sentinel lymph nodes involved.  She received adjuvant radiation completed April of 2010 and started letrozole at that time. She is tolerating the letrozole well, with repeat bone density study later this year scheduled for followup of her osteopenia.  The overall plan is for her to continue the letrozole to April of 2015. She will see me at that time and likely she will "graduate" from followup here then. She knows to call for any problems that may develop before that visit.   I personally saw this patient and performed a substantive portion of this encounter with the listed APP documented  above.   Ruthann Cancer, M.D.

## 2013-08-08 NOTE — Patient Instructions (Addendum)
Please contact us at [phone] if you have any questions or concerns.  Please continue to do well and enjoy life!!!  Get plenty of rest, drink plenty of water, exercise daily (walking), eat a balanced diet.  Continue to take calcium and vitamin D daily.

## 2013-08-09 LAB — VITAMIN D 25 HYDROXY (VIT D DEFICIENCY, FRACTURES): Vit D, 25-Hydroxy: 57 ng/mL (ref 30–89)

## 2013-08-11 ENCOUNTER — Telehealth: Payer: Self-pay

## 2013-08-11 NOTE — Telephone Encounter (Signed)
LMOVM regarding labs for 8/18. Per [facility], labs are ok. Call office with any questions. TMB

## 2013-08-12 ENCOUNTER — Telehealth: Payer: Self-pay | Admitting: *Deleted

## 2013-08-12 NOTE — Telephone Encounter (Signed)
Patient called and left voicemail message regarding her lab results. I have given the desk RN the message

## 2013-12-07 ENCOUNTER — Ambulatory Visit
Admission: RE | Admit: 2013-12-07 | Discharge: 2013-12-07 | Disposition: A | Discharge status: Home / Self Care | Payer: Commercial Indemnity | Source: Ambulatory Visit | Attending: Family | Admitting: Family

## 2013-12-07 DIAGNOSIS — Z853 Personal history of malignant neoplasm of breast: Secondary | ICD-10-CM

## 2013-12-28 ENCOUNTER — Other Ambulatory Visit: Payer: Self-pay | Admitting: Obstetrics and Gynecology

## 2013-12-28 DIAGNOSIS — C50912 Malignant neoplasm of unspecified site of left female breast: Secondary | ICD-10-CM

## 2014-03-15 ENCOUNTER — Telehealth: Payer: Self-pay | Admitting: Oncology

## 2014-03-15 NOTE — Telephone Encounter (Signed)
, °

## 2014-03-21 ENCOUNTER — Telehealth: Payer: Self-pay | Admitting: Oncology

## 2014-03-21 ENCOUNTER — Other Ambulatory Visit: Payer: Self-pay | Admitting: Oncology

## 2014-03-21 NOTE — Telephone Encounter (Signed)
, °

## 2014-03-27 ENCOUNTER — Encounter: Payer: 59 | Admitting: Oncology

## 2014-03-27 ENCOUNTER — Other Ambulatory Visit: Payer: 59

## 2014-03-28 ENCOUNTER — Ambulatory Visit: Payer: Commercial Indemnity

## 2014-03-28 ENCOUNTER — Other Ambulatory Visit: Payer: Commercial Indemnity

## 2014-04-04 ENCOUNTER — Other Ambulatory Visit: Payer: Self-pay | Admitting: Physician Assistant

## 2014-04-11 ENCOUNTER — Other Ambulatory Visit: Payer: Self-pay | Admitting: Obstetrics and Gynecology

## 2014-04-11 DIAGNOSIS — C50919 Malignant neoplasm of unspecified site of unspecified female breast: Secondary | ICD-10-CM

## 2014-04-20 ENCOUNTER — Other Ambulatory Visit: Payer: Commercial Indemnity

## 2014-04-21 ENCOUNTER — Other Ambulatory Visit (HOSPITAL_BASED_OUTPATIENT_CLINIC_OR_DEPARTMENT_OTHER): Payer: Commercial Indemnity

## 2014-04-21 ENCOUNTER — Telehealth: Payer: Self-pay | Admitting: Hematology and Oncology

## 2014-04-21 ENCOUNTER — Ambulatory Visit (HOSPITAL_BASED_OUTPATIENT_CLINIC_OR_DEPARTMENT_OTHER): Payer: Commercial Indemnity | Admitting: Hematology and Oncology

## 2014-04-21 VITALS — BP 107/71 | HR 105 | Temp 97.9°F | Resp 20 | Ht 62.0 in | Wt 225.1 lb

## 2014-04-21 DIAGNOSIS — Z853 Personal history of malignant neoplasm of breast: Secondary | ICD-10-CM

## 2014-04-21 DIAGNOSIS — C50919 Malignant neoplasm of unspecified site of unspecified female breast: Secondary | ICD-10-CM

## 2014-04-21 DIAGNOSIS — Z170 Estrogen receptor positive status [ER+]: Secondary | ICD-10-CM

## 2014-04-21 DIAGNOSIS — M949 Disorder of cartilage, unspecified: Secondary | ICD-10-CM

## 2014-04-21 DIAGNOSIS — M899 Disorder of bone, unspecified: Secondary | ICD-10-CM

## 2014-04-21 LAB — COMPREHENSIVE METABOLIC PANEL (CC13)
ALT: 16 U/L (ref 0–55)
ANION GAP: 11 meq/L (ref 3–11)
AST: 17 U/L (ref 5–34)
Albumin: 3.7 g/dL (ref 3.5–5.0)
Alkaline Phosphatase: 65 U/L (ref 40–150)
BILIRUBIN TOTAL: 0.55 mg/dL (ref 0.20–1.20)
BUN: 17.7 mg/dL (ref 7.0–26.0)
CALCIUM: 9.8 mg/dL (ref 8.4–10.4)
CHLORIDE: 106 meq/L (ref 98–109)
CO2: 24 meq/L (ref 22–29)
Creatinine: 0.9 mg/dL (ref 0.6–1.1)
GLUCOSE: 205 mg/dL — AB (ref 70–140)
Potassium: 4.5 mEq/L (ref 3.5–5.1)
SODIUM: 141 meq/L (ref 136–145)
TOTAL PROTEIN: 7.1 g/dL (ref 6.4–8.3)

## 2014-04-21 LAB — CBC WITH DIFFERENTIAL/PLATELET
BASO%: 0.8 % (ref 0.0–2.0)
Basophils Absolute: 0 10*3/uL (ref 0.0–0.1)
EOS ABS: 0.1 10*3/uL (ref 0.0–0.5)
EOS%: 1.2 % (ref 0.0–7.0)
HEMATOCRIT: 38.3 % (ref 34.8–46.6)
HGB: 12.1 g/dL (ref 11.6–15.9)
LYMPH%: 29.8 % (ref 14.0–49.7)
MCH: 28 pg (ref 25.1–34.0)
MCHC: 31.6 g/dL (ref 31.5–36.0)
MCV: 88.7 fL (ref 79.5–101.0)
MONO#: 0.4 10*3/uL (ref 0.1–0.9)
MONO%: 7.7 % (ref 0.0–14.0)
NEUT%: 60.5 % (ref 38.4–76.8)
NEUTROS ABS: 3.1 10*3/uL (ref 1.5–6.5)
PLATELETS: 291 10*3/uL (ref 145–400)
RBC: 4.32 10*6/uL (ref 3.70–5.45)
RDW: 13 % (ref 11.2–14.5)
WBC: 5.2 10*3/uL (ref 3.9–10.3)
lymph#: 1.5 10*3/uL (ref 0.9–3.3)

## 2014-04-21 LAB — LACTATE DEHYDROGENASE (CC13): LDH: 161 U/L (ref 125–245)

## 2014-04-21 NOTE — Telephone Encounter (Signed)
, °

## 2014-04-21 NOTE — Progress Notes (Signed)
[facility]  [phone] [phone]  OFFICE PROGRESS NOTE   ID: Mary [name]   DOB: 12/18/1956  MR#: [id number]  CSN#:[id number]   PCP: PATERSON,Mary [name] GYN: Mary S.  [name], M.D. SU:Mary [name], M.D.,    RAD ONC: Mary [name], M.D.   HISTORY OF PRESENT ILLNESS: From Dr. Peter [name]'s new patient evaluation note dated 02/07/2009: "This is a pleasant 58-year-old woman from [place], referred by Dr. Toth for evaluation and treatment of breast cancer.This woman has been in relatively good health.  She has annual screening mammography.  Mammogram from October 25, 2008 at the [facility] showed calcification in the left breast.  Further images were recommended.  A followup mammogram showed calcifications and a tissue biopsy was recommended.  A biopsy performed on 11/03/2008 showed DCIS, calcifications and necrosis, ER and PR positive at 100% and 60% respectively.  A preoperative MRI scan was performed on 11/13/2008 and this showed a non-mass like rim enhancement in the left breast at 6 o'clock in biopsy cavity.  No other abnormalities were seen.  The patient elected to go on lumpectomy.  A lumpectomy performed on 12/12/2008 showed a focus of DCIS, high grade of calcifications and associated with a focus of invasive ductal cancer, grade 2/3 measuring 0.5 cm.  Lymphovascular invasion was not seen.  Surgical margins were clear.  The invasive component was ER and PR positive at 100% and 94% respectively, proliferative index 22%, and HER-2 was 0. Mary [name] has had an unremarkable postoperative course.  She has little bit tenderness in the left axilla."  Her subsequent history is as detailed below.   INTERVAL HISTORY: Mary [name] is completing her 5 years of Femara on this Sunday. She has tolerated femara very well. She had date bone densitometry performed   by Dr. Mccombs and she says bone density slight improvement compared to before. She says that  she lost weight by walking. She says she wanted to enroll in a exercise program. She denies any decrease in appetite, constipation, blood in the stool blood in the urine, she is up-to-date with her Pap smears. She says she does not get any flu vaccine. Her last mammogram performed in December 2014 was negative for any mammographic evidence of malignancy  REVIEW OF SYSTEMS: A 10 point review of systems was completed and pertinent symptoms were mentioned in interval history  PAST MEDICAL HISTORY: Past Medical History  Diagnosis Date  . Breast cancer 10/2008    Left breast invasive ductal carcinoma  . Diabetes mellitus without complication   . Hypertension   . GERD (gastroesophageal reflux disease)   . Osteopenia   . Hyperlipidemia     PAST SURGICAL HISTORY: Past Surgical History  Procedure Laterality Date  . Breast lumpectomy with axillary lymph node biopsy Left     Lumpectomy in 11/2008 and axillary biopsy in 12/2008  . Abdominal hysterectomy  11/1998  . Dilation and curettage of uterus  08/1998  Includes hysterectomy with oophorectomy in 1999 status post D&C.   FAMILY HISTORY Family History  Problem Relation Age of Onset  . Heart attack Mother   . Hypertension Mother   . Diabetes Mother   . Cancer Father     Prostate cancer  . Hypertension Brother   Father is deceased from prostate cancer.  Mother is deceased from a heart attack.   She has no sisters. One brother is alive and well.   GYNECOLOGIC HISTORY: G0, P0, menarche age 9, menopause at the   time of hysterectomy, hormone replacement therapy after hysterectomy.  Hormone replacement therapy was discontinued before her breast cancer diagnosis .  SOCIAL HISTORY: The patient was married for 15 years and was divorced in 1990.  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  In her spare time she enjoys cooking/baking,  and walking her new dog [name].  ADVANCED DIRECTIVES: Not on file  HEALTH MAINTENANCE: History  Substance Use Topics  . Smoking status: Former Smoker  . Smokeless tobacco: Never Used     Comment: Quit smoking in 1990  . Alcohol Use: No    Colonoscopy:  Not on file PAP:  Not on file Bone density:  The patient's last bone density scan on 12/22/2011 showed a T score of -1.3 (osteopenia). Lipid panel:  Not on file  Allergies  Allergen Reactions  . Codeine Nausea And Vomiting    Current Outpatient Prescriptions  Medication Sig Dispense Refill  . aspirin 81 MG tablet Take 81 mg by mouth daily.      . atorvastatin (LIPITOR) 10 MG tablet Take 10 mg by mouth daily.      . Calcium-Magnesium-Vitamin D (CITRACAL CALCIUM+D PO) Take 2 tablets by mouth daily. calcim 630/vitamin d 500      . Cholecalciferol (VITAMIN D3) 2000 UNITS TABS Take 1 tablet by mouth 2 (two) times daily.       . esomeprazole (NEXIUM) 40 MG capsule Take 40 mg by mouth daily before breakfast.      . glimepiride (AMARYL) 4 MG tablet Take 4 mg by mouth 2 (two) times daily.       . letrozole (FEMARA) 2.5 MG tablet Take 2.5 mg by mouth daily. Will finish femara on Sun      . metFORMIN (GLUCOPHAGE) 500 MG tablet Take 1,000 mg by mouth 2 (two) times daily with a meal.      . Multiple Vitamin (MULTIVITAMIN) tablet Take 1 tablet by mouth daily.      . pioglitazone (ACTOS) 30 MG tablet Take 30 mg by mouth daily.      . telmisartan-hydrochlorothiazide (MICARDIS HCT) 40-12.5 MG per tablet Take 1 tablet by mouth daily.       No current facility-administered medications for this visit.    OBJECTIVE: Filed Vitals:   04/21/14 0839  BP: 107/71  Pulse: 105  Temp: 97.9 F (36.6 C)  Resp: 20     Body mass index is 41.16 kg/(m^2).      ECOG FS: 0 - Asymptomatic  General appearance: Alert, cooperative, well nourished, no apparent distress Head: Normocephalic, without obvious abnormality, atraumatic Eyes: Arcus senilis, PERRLA,  EOMI Nose: Nares, septum and mucosa are normal, no drainage or sinus tenderness Neck: Supple, symmetrical, trachea midline, excessive habitus, no tenderness Resp: Clear to auscultation bilaterally Cardio: Regular rate and rhythm, S1, S2 normal, no murmur, click, rub or gallop Breasts: Pendulous bilaterally, left breast has well-healed surgical scars, left breast has surgical architectural  and radiation changes noted, glandular breast tissue bilaterally (left breast > right breast), bilateral axillary fullness. No bilateral axillary lymphadenopathy appreciated GI: Soft, distended, non-tender, hypoactive bowel sounds, excessive habitus Extremities: Extremities normal, atraumatic, no cyanosis or edema Lymph nodes:Supraclavicular nodes normal Neurologic: Grossly normal   LAB RESULTS: Lab Results  Component Value Date   WBC 5.2 04/21/2014   NEUTROABS 3.1 04/21/2014   HGB 12.1 04/21/2014   HCT 38.3 04/21/2014   MCV 88.7   04/21/2014   PLT 291 04/21/2014      Chemistry      Component Value Date/Time   NA 141 04/21/2014 0821   NA 138 05/28/2012 1052   K 4.5 04/21/2014 0821   K 4.5 05/28/2012 1052   CL 103 12/10/2012 0754   CL 102 05/28/2012 1052   CO2 24 04/21/2014 0821   CO2 27 05/28/2012 1052   BUN 17.7 04/21/2014 0821   BUN 16 05/28/2012 1052   CREATININE 0.9 04/21/2014 0821   CREATININE 0.76 05/28/2012 1052      Component Value Date/Time   CALCIUM 9.8 04/21/2014 0821   CALCIUM 9.2 05/28/2012 1052   ALKPHOS 65 04/21/2014 0821   ALKPHOS 65 05/28/2012 1052   AST 17 04/21/2014 0821   AST 17 05/28/2012 1052   ALT 16 04/21/2014 0821   ALT 19 05/28/2012 1052   BILITOT 0.55 04/21/2014 0821   BILITOT 0.5 05/28/2012 1052      Lab Results  Component Value Date   LABCA2 10 05/28/2012    Urinalysis No results found for this basename: colorurine,  appearanceur,  labspec,  phurine,  glucoseu,  hgbur,  bilirubinur,  ketonesur,  proteinur,  urobilinogen,  nitrite,  leukocytesur    STUDIES: 1.  The patient's last bone density scan  on 12/22/2011 showed a T score of -1.3 (osteopenia).  2.  The patient's last bilateral digital diagnostic mammogram on 12/06/2012 showed the breast tissue is almost entirely fatty.  Left lumpectomy changes are present.  There are coarse benign dystrophic calcifications in the left lumpectomy site.  There is no suspicious dominant mass, nonsurgical architectural distortion or calcification to suggest malignancy.  No mammographic evidence of malignancy.   ASSESSMENT: 58 y.o. [place], [place] woman: 1.  The patient had a left breast needle core biopsy at the 6 o'clock position on 11/03/2008 which showed ductal carcinoma in situ with calcifications and necrosis, estrogen receptor 100% positive, progesterone receptor 60% positive.  2.  The patient had a bilateral breast MRI on 11/13/2008 which showed non mass-like rim enhancement of the left breast 6 o'clock biopsy cavity in the area of known DCIS.  The extent of the involvement of DCIS is best approximated by mammographically evident calcifications; bracketing could be considered at the time of preoperative needle localization. No other area of abnormal enhancement was seen in either breast.  No lymphadenopathy or other abnormal T2-weighted hyperintensity was seen.  No MRI evidence for multifocal or multicentric disease (clinical stage 0, PTis).  3.  Status post left breast lumpectomy on 12/12/2008 for a stage I, pT1a, pNX, pMX, 0.5 cm invasive ductal carcinoma, grade 2, and high-grade ductal carcinoma in situ with calcifications and necrosis, the margins were negative for carcinoma, estrogen receptor 100% positive, progesterone receptor 94% positive, Ki-67 22%, HER-2/neu by CISH no amplification with a ratio of 1.10.  4.  Status post left axillary sentinel node biopsy on 01/12/2009 which showed 0/3 metastatic left axillary lymph nodes.  5.  The patient underwent radiation therapy from 02/19/2009 through 03/30/2009.    6.  The patient started  antiestrogen therapy with Femara in 03/2009  completing on 04/23/2014  7.  Osteopenia   PLAN: Mary [name] will be graduating from her  5 years of Femara on  04/23/2014. I have reviewed her mammogram reports,  CBC and CMP.  Since she completing her 5 years of Femara and she's been doing very well,  we will transfer her care to primary care physician as recommended during the previous   visit I have instructed the patient to continue taking calcium with vitamin D supplementation for osteopenia  The patient states that  Dr. McComb's does her bone density scans.  I have asked the patient to follow up with primary care physician for annual mammograms and lab work Once again I have discussed with the patient on future care with the primary care physician and  she can followup with breast cancer Center as needed basis  All questions were answered.  The patient was encouraged to contact us with any problems, questions or concerns.   Padma Kamineni, M.D., Medical oncology  04/21/2014, 9:29 AM    

## 2014-04-22 LAB — VITAMIN D 25 HYDROXY (VIT D DEFICIENCY, FRACTURES): VIT D 25 HYDROXY: 50 ng/mL (ref 30–89)

## 2014-12-08 ENCOUNTER — Ambulatory Visit
Admission: RE | Admit: 2014-12-08 | Discharge: 2014-12-08 | Disposition: A | Discharge status: Home / Self Care | Payer: Commercial Indemnity | Source: Ambulatory Visit | Attending: Hematology and Oncology | Admitting: Hematology and Oncology

## 2014-12-08 DIAGNOSIS — C50919 Malignant neoplasm of unspecified site of unspecified female breast: Secondary | ICD-10-CM

## 2015-01-18 ENCOUNTER — Other Ambulatory Visit: Payer: Self-pay | Admitting: Obstetrics and Gynecology

## 2015-01-19 LAB — CYTOLOGY - PAP

## 2016-01-25 ENCOUNTER — Other Ambulatory Visit: Payer: Self-pay | Admitting: Obstetrics and Gynecology

## 2016-01-25 DIAGNOSIS — Z853 Personal history of malignant neoplasm of breast: Secondary | ICD-10-CM

## 2016-01-31 ENCOUNTER — Ambulatory Visit
Admission: RE | Admit: 2016-01-31 | Discharge: 2016-01-31 | Disposition: A | Discharge status: Home / Self Care | Payer: Managed Care, Other (non HMO) | Source: Ambulatory Visit | Attending: Obstetrics and Gynecology | Admitting: Obstetrics and Gynecology

## 2016-01-31 DIAGNOSIS — Z853 Personal history of malignant neoplasm of breast: Secondary | ICD-10-CM

## 2016-03-27 ENCOUNTER — Encounter: Payer: Self-pay | Admitting: Obstetrics and Gynecology

## 2018-02-05 NOTE — Progress Notes (Signed)
This encounter was created in error - please disregard.

## 2018-07-27 ENCOUNTER — Ambulatory Visit (INDEPENDENT_AMBULATORY_CARE_PROVIDER_SITE_OTHER): Payer: Managed Care, Other (non HMO) | Admitting: Orthopedic Surgery

## 2018-07-27 ENCOUNTER — Encounter (INDEPENDENT_AMBULATORY_CARE_PROVIDER_SITE_OTHER): Payer: Self-pay | Admitting: Orthopedic Surgery

## 2018-07-27 ENCOUNTER — Ambulatory Visit (INDEPENDENT_AMBULATORY_CARE_PROVIDER_SITE_OTHER): Payer: Self-pay

## 2018-07-27 ENCOUNTER — Ambulatory Visit (INDEPENDENT_AMBULATORY_CARE_PROVIDER_SITE_OTHER): Payer: Managed Care, Other (non HMO)

## 2018-07-27 DIAGNOSIS — M25562 Pain in left knee: Secondary | ICD-10-CM | POA: Diagnosis not present

## 2018-07-27 DIAGNOSIS — Z6841 Body Mass Index (BMI) 40.0 and over, adult: Secondary | ICD-10-CM

## 2018-07-27 DIAGNOSIS — M25561 Pain in right knee: Secondary | ICD-10-CM | POA: Diagnosis not present

## 2018-08-10 ENCOUNTER — Encounter (INDEPENDENT_AMBULATORY_CARE_PROVIDER_SITE_OTHER): Payer: Self-pay | Admitting: Orthopedic Surgery

## 2018-08-10 DIAGNOSIS — M25561 Pain in right knee: Secondary | ICD-10-CM | POA: Diagnosis not present

## 2018-08-10 MED ORDER — LIDOCAINE HCL 1 % IJ SOLN
5.0000 mL | INTRAMUSCULAR | Status: AC | PRN
  Administered 2018-08-10: 5 mL

## 2018-08-10 MED ORDER — METHYLPREDNISOLONE ACETATE 40 MG/ML IJ SUSP
40.0000 mg | INTRAMUSCULAR | Status: AC | PRN
  Administered 2018-08-10: 40 mg via INTRA_ARTICULAR

## 2018-08-10 NOTE — Progress Notes (Addendum)
Office Visit Note   Patient: Mary [name]           Date of Birth: 12/14/1956           MRN: [id number] Visit Date: 07/27/2018              Requested by: Leanna Battles, [address], [place] [address] PCP: Leanna Battles, MD  Chief Complaint  Patient presents with  . Left Knee - Pain  . Right Knee - Pain      HPI: Patient is a 62 year old woman who presents complaining of bilateral knee pain right worse than left.  Patient states she has had knee pain for years with swelling.  She has used anti-inflammatories well-padded sneakers she has been to a chiropractor for adjustments and complains of pain with activities of daily living.  Assessment & Plan: Visit Diagnoses:  1. Acute pain of both knees   2. Morbid obesity ([place])   3. Body mass index 40.0-44.9, adult (HCC)     Plan: The right knee was injected she tolerated this well recommended weight loss and proper diet if she wishes to pursue a total knee arthroplasty.  Follow-Up Instructions: Return in about 4 weeks (around 08/24/2018).   Ortho Exam  Patient is alert, oriented, no adenopathy, well-dressed, normal affect, normal respiratory effort. Examination patient has an antalgic gait.  Current BMI 43.  Patient has tenderness to palpation primarily over the medial joint line bilaterally worse on the right than the left.  Collaterals and cruciates are stable.  There is no effusion no cellulitis  Imaging: No results found. No images are attached to the encounter.  Labs: No results found for: HGBA1C, ESRSEDRATE, CRP, LABURIC, REPTSTATUS, GRAMSTAIN, CULT, LABORGA   Lab Results  Component Value Date   ALBUMIN 3.7 04/21/2014   ALBUMIN 3.7 08/08/2013   ALBUMIN 3.8 12/10/2012    There is no height or weight on file to calculate BMI.  Orders:  Orders Placed This Encounter  Procedures  . Large Joint Inj: R knee  . XR Knee 1-2 Views Right  . XR Knee 1-2 Views Left   Meds ordered this encounter    Medications  . lidocaine (XYLOCAINE) 1 % (with pres) injection 5 mL  . methylPREDNISolone acetate (DEPO-MEDROL) injection 40 mg     Procedures: Large Joint Inj: R knee on 08/10/2018 11:57 AM Indications: pain and diagnostic evaluation Details: 22 G 1.5 in needle, anteromedial approach  Arthrogram: No  Medications: 5 mL lidocaine 1 %; 40 mg methylPREDNISolone acetate 40 MG/ML Outcome: tolerated well, no immediate complications Procedure, treatment alternatives, risks and benefits explained, specific risks discussed. Consent was given by the patient. Immediately prior to procedure a time out was called to verify the correct patient, procedure, equipment, support staff and site/side marked as required. Patient was prepped and draped in the usual sterile fashion.      Clinical Data: No additional findings.  ROS:  All other systems negative, except as noted in the HPI. Review of Systems  Objective: Vital Signs: There were no vitals taken for this visit.  Specialty Comments:  No specialty comments available.  PMFS History: Patient Active Problem List   Diagnosis Date Noted  . Breast cancer ([place]) 06/04/2012   Past Medical History:  Diagnosis Date  . Breast cancer ([place]) 10/2008   Left breast invasive ductal carcinoma  . Diabetes mellitus without complication ([place])   . GERD (gastroesophageal reflux disease)   . Hyperlipidemia   . Hypertension   .  Osteopenia     Family History  Problem Relation Age of Onset  . Heart attack Mother   . Hypertension Mother   . Diabetes Mother   . Cancer Father        Prostate cancer  . Hypertension Brother     Past Surgical History:  Procedure Laterality Date  . ABDOMINAL HYSTERECTOMY  11/1998  . BREAST LUMPECTOMY WITH AXILLARY LYMPH NODE BIOPSY Left    Lumpectomy in 11/2008 and axillary biopsy in 12/2008  . DILATION AND CURETTAGE OF UTERUS  08/1998   Social History   Occupational History  . Not on file  Tobacco Use  . Smoking  status: Former Research scientist (life sciences)  . Smokeless tobacco: Never Used  . Tobacco comment: Quit smoking in 1990  Substance and Sexual Activity  . Alcohol use: No  . Drug use: No  . Sexual activity: Never    Birth control/protection: Surgical, Post-menopausal

## 2019-04-22 DIAGNOSIS — E785 Hyperlipidemia, unspecified: Secondary | ICD-10-CM | POA: Diagnosis not present

## 2019-04-22 DIAGNOSIS — E1151 Type 2 diabetes mellitus with diabetic peripheral angiopathy without gangrene: Secondary | ICD-10-CM | POA: Diagnosis not present

## 2019-04-22 DIAGNOSIS — M25561 Pain in right knee: Secondary | ICD-10-CM | POA: Diagnosis not present

## 2019-04-22 DIAGNOSIS — I10 Essential (primary) hypertension: Secondary | ICD-10-CM | POA: Diagnosis not present

## 2019-05-02 DIAGNOSIS — R69 Illness, unspecified: Secondary | ICD-10-CM | POA: Diagnosis not present

## 2019-05-12 DIAGNOSIS — R69 Illness, unspecified: Secondary | ICD-10-CM | POA: Diagnosis not present

## 2019-06-07 DIAGNOSIS — R69 Illness, unspecified: Secondary | ICD-10-CM | POA: Diagnosis not present

## 2019-06-08 DIAGNOSIS — I10 Essential (primary) hypertension: Secondary | ICD-10-CM | POA: Diagnosis not present

## 2019-06-08 DIAGNOSIS — E1151 Type 2 diabetes mellitus with diabetic peripheral angiopathy without gangrene: Secondary | ICD-10-CM | POA: Diagnosis not present

## 2019-06-13 DIAGNOSIS — R69 Illness, unspecified: Secondary | ICD-10-CM | POA: Diagnosis not present

## 2019-06-30 DIAGNOSIS — E119 Type 2 diabetes mellitus without complications: Secondary | ICD-10-CM | POA: Diagnosis not present

## 2019-06-30 DIAGNOSIS — H40013 Open angle with borderline findings, low risk, bilateral: Secondary | ICD-10-CM | POA: Diagnosis not present

## 2019-06-30 DIAGNOSIS — H2513 Age-related nuclear cataract, bilateral: Secondary | ICD-10-CM | POA: Diagnosis not present

## 2019-07-13 DIAGNOSIS — R69 Illness, unspecified: Secondary | ICD-10-CM | POA: Diagnosis not present

## 2019-08-19 DIAGNOSIS — R69 Illness, unspecified: Secondary | ICD-10-CM | POA: Diagnosis not present

## 2019-08-25 DIAGNOSIS — E7849 Other hyperlipidemia: Secondary | ICD-10-CM | POA: Diagnosis not present

## 2019-08-25 DIAGNOSIS — E119 Type 2 diabetes mellitus without complications: Secondary | ICD-10-CM | POA: Diagnosis not present

## 2019-09-01 DIAGNOSIS — R82998 Other abnormal findings in urine: Secondary | ICD-10-CM | POA: Diagnosis not present

## 2019-09-02 DIAGNOSIS — Z0000 Encounter for general adult medical examination without abnormal findings: Secondary | ICD-10-CM | POA: Diagnosis not present

## 2019-09-02 DIAGNOSIS — M25561 Pain in right knee: Secondary | ICD-10-CM | POA: Diagnosis not present

## 2019-09-02 DIAGNOSIS — E1151 Type 2 diabetes mellitus with diabetic peripheral angiopathy without gangrene: Secondary | ICD-10-CM | POA: Diagnosis not present

## 2019-09-02 DIAGNOSIS — E785 Hyperlipidemia, unspecified: Secondary | ICD-10-CM | POA: Diagnosis not present

## 2019-09-02 DIAGNOSIS — I739 Peripheral vascular disease, unspecified: Secondary | ICD-10-CM | POA: Diagnosis not present

## 2019-09-02 DIAGNOSIS — I10 Essential (primary) hypertension: Secondary | ICD-10-CM | POA: Diagnosis not present

## 2019-09-02 DIAGNOSIS — C50919 Malignant neoplasm of unspecified site of unspecified female breast: Secondary | ICD-10-CM | POA: Diagnosis not present

## 2019-09-08 DIAGNOSIS — Z1212 Encounter for screening for malignant neoplasm of rectum: Secondary | ICD-10-CM | POA: Diagnosis not present

## 2019-10-08 DIAGNOSIS — R69 Illness, unspecified: Secondary | ICD-10-CM | POA: Diagnosis not present

## 2019-11-03 DIAGNOSIS — R69 Illness, unspecified: Secondary | ICD-10-CM | POA: Diagnosis not present

## 2019-11-22 DIAGNOSIS — R69 Illness, unspecified: Secondary | ICD-10-CM | POA: Diagnosis not present

## 2020-01-02 DIAGNOSIS — I10 Essential (primary) hypertension: Secondary | ICD-10-CM | POA: Diagnosis not present

## 2020-01-02 DIAGNOSIS — E1151 Type 2 diabetes mellitus with diabetic peripheral angiopathy without gangrene: Secondary | ICD-10-CM | POA: Diagnosis not present

## 2020-01-02 DIAGNOSIS — E785 Hyperlipidemia, unspecified: Secondary | ICD-10-CM | POA: Diagnosis not present

## 2020-01-02 DIAGNOSIS — C50919 Malignant neoplasm of unspecified site of unspecified female breast: Secondary | ICD-10-CM | POA: Diagnosis not present

## 2020-01-02 DIAGNOSIS — M25561 Pain in right knee: Secondary | ICD-10-CM | POA: Diagnosis not present

## 2020-01-09 DIAGNOSIS — E119 Type 2 diabetes mellitus without complications: Secondary | ICD-10-CM | POA: Diagnosis not present

## 2020-01-17 DIAGNOSIS — R69 Illness, unspecified: Secondary | ICD-10-CM | POA: Diagnosis not present

## 2020-03-12 DIAGNOSIS — R69 Illness, unspecified: Secondary | ICD-10-CM | POA: Diagnosis not present

## 2020-05-04 DIAGNOSIS — E785 Hyperlipidemia, unspecified: Secondary | ICD-10-CM | POA: Diagnosis not present

## 2020-05-04 DIAGNOSIS — M25561 Pain in right knee: Secondary | ICD-10-CM | POA: Diagnosis not present

## 2020-05-04 DIAGNOSIS — I10 Essential (primary) hypertension: Secondary | ICD-10-CM | POA: Diagnosis not present

## 2020-05-04 DIAGNOSIS — I739 Peripheral vascular disease, unspecified: Secondary | ICD-10-CM | POA: Diagnosis not present

## 2020-05-04 DIAGNOSIS — E1151 Type 2 diabetes mellitus with diabetic peripheral angiopathy without gangrene: Secondary | ICD-10-CM | POA: Diagnosis not present

## 2020-05-08 DIAGNOSIS — R69 Illness, unspecified: Secondary | ICD-10-CM | POA: Diagnosis not present

## 2020-05-10 ENCOUNTER — Other Ambulatory Visit: Payer: Self-pay | Admitting: Internal Medicine

## 2020-05-10 DIAGNOSIS — Z1231 Encounter for screening mammogram for malignant neoplasm of breast: Secondary | ICD-10-CM

## 2020-06-01 ENCOUNTER — Ambulatory Visit
Admission: RE | Admit: 2020-06-01 | Discharge: 2020-06-01 | Disposition: A | Discharge status: Home / Self Care | Payer: Medicare HMO | Source: Ambulatory Visit | Attending: Internal Medicine | Admitting: Internal Medicine

## 2020-06-01 ENCOUNTER — Other Ambulatory Visit: Payer: Self-pay

## 2020-06-01 DIAGNOSIS — Z1231 Encounter for screening mammogram for malignant neoplasm of breast: Secondary | ICD-10-CM

## 2020-06-04 LAB — HEMOGLOBIN A1C: Hemoglobin A1C: 8.7

## 2020-06-21 ENCOUNTER — Encounter: Payer: Self-pay | Admitting: Osteopathic Medicine

## 2020-06-21 ENCOUNTER — Ambulatory Visit (INDEPENDENT_AMBULATORY_CARE_PROVIDER_SITE_OTHER): Payer: Medicare HMO | Admitting: Osteopathic Medicine

## 2020-06-21 VITALS — BP 102/72 | HR 119 | Temp 97.0°F | Ht 61.0 in | Wt 224.0 lb

## 2020-06-21 DIAGNOSIS — E1169 Type 2 diabetes mellitus with other specified complication: Secondary | ICD-10-CM | POA: Diagnosis not present

## 2020-06-21 DIAGNOSIS — Z853 Personal history of malignant neoplasm of breast: Secondary | ICD-10-CM | POA: Diagnosis not present

## 2020-06-21 DIAGNOSIS — E785 Hyperlipidemia, unspecified: Secondary | ICD-10-CM

## 2020-06-21 DIAGNOSIS — K219 Gastro-esophageal reflux disease without esophagitis: Secondary | ICD-10-CM | POA: Diagnosis not present

## 2020-06-21 DIAGNOSIS — Z0000 Encounter for general adult medical examination without abnormal findings: Secondary | ICD-10-CM

## 2020-06-21 DIAGNOSIS — E1159 Type 2 diabetes mellitus with other circulatory complications: Secondary | ICD-10-CM | POA: Diagnosis not present

## 2020-06-21 DIAGNOSIS — I10 Essential (primary) hypertension: Secondary | ICD-10-CM | POA: Diagnosis not present

## 2020-06-21 DIAGNOSIS — I152 Hypertension secondary to endocrine disorders: Secondary | ICD-10-CM

## 2020-06-21 NOTE — Patient Instructions (Addendum)
Plan:  Diabetes:  Due for recheck A1C no earlire than 08/04/2020  Can wait to recheck it with labs in September prior to your annual  Continue Rx:  Metformin 1000 mg twice daily  Jardiance 25 mg daily (refill sent)  Actos 30 mg daily  Can HOLD Glimepiride for now   Blood pressure:  At goal, continue current medications  Cholesterol  Will check with labs   Will get records from Dr Lambert Keto in September when due. Labs ordered for future visit. Annual physical / preventive care was NOT performed or billed today.

## 2020-06-21 NOTE — Progress Notes (Signed)
Mary [name] is a 64 y.o. female who presents to  [employer] at [facility]  today, 06/22/20, seeking care for the following:  . New to establish . Chronic conditions as below  . Recent discharge from previous practice d/t incompatibility  . Concerned about diabetes numbers/regimen - see scanned docs for patient documentation on this. Recent A1C 8.7 while on Metformin, Glipizide, Actos She recently restarted Jardiance, there was an issue w/ getting Glipizide filled/covered?      ASSESSMENT & PLAN with other pertinent findings:  The primary encounter diagnosis was Controlled type 2 diabetes mellitus with other specified complication, without long-term current use of insulin ([place]). Diagnoses of History of breast cancer, Hypertension associated with diabetes ([place]), Hyperlipidemia associated with type 2 diabetes mellitus ([place]), Gastroesophageal reflux disease, unspecified whether esophagitis present   No results found for this or any previous visit (from the past 57 hour(s)).     Patient Instructions  Plan:  Diabetes:  Due for recheck A1C no earlire than 08/04/2020  Can wait to recheck it with labs in September prior to your annual  Continue Rx:  Metformin 1000 mg twice daily  Jardiance 25 mg daily (refill sent)  Actos 30 mg daily  Can HOLD Glimepiride for now   Blood pressure:  At goal, continue current medications  Cholesterol  Will check with labs   Will get records from Dr Lambert Keto in September when due. Labs ordered for future visit. Annual physical / preventive care was NOT performed or billed today.     Orders Placed This Encounter  Procedures  . CBC  . COMPLETE METABOLIC PANEL WITH GFR  . Lipid panel  . Hemoglobin A1c    Meds ordered this encounter  Medications  . empagliflozin (JARDIANCE) 25 MG TABS tablet    Sig: Take 1 tablet (25 mg total) by mouth daily before breakfast.    Dispense:  90  tablet    Refill:  1       Follow-up instructions: Return in about 2 months (around 09/05/2020) for ANNUAL (get labs prior to visit, orders are in).                                         BP 102/72 (BP Location: Right Arm, Patient Position: Sitting)   Pulse (!) 119   Temp (!) 97 F (36.1 C)   Ht 5\' 1"  (1.549 m)   Wt 224 lb (101.6 kg)   SpO2 97%   BMI 42.32 kg/m   Current Meds  Medication Sig  . aspirin 81 MG tablet Take 81 mg by mouth daily.  Marland Kitchen atorvastatin (LIPITOR) 10 MG tablet Take 10 mg by mouth daily.  . Calcium-Magnesium-Vitamin D (CITRACAL CALCIUM+D PO) Take 2 tablets by mouth daily. calcim 630/vitamin d 500  . Cholecalciferol (VITAMIN D3) 2000 UNITS TABS Take 1 tablet by mouth 2 (two) times daily.   Marland Kitchen esomeprazole (NEXIUM) 40 MG capsule Take 40 mg by mouth daily before breakfast.  . glimepiride (AMARYL) 4 MG tablet Take 4 mg by mouth 2 (two) times daily.   Marland Kitchen letrozole (FEMARA) 2.5 MG tablet Take 2.5 mg by mouth daily. Will finish femara on Sun  . metFORMIN (GLUCOPHAGE) 500 MG tablet Take 1,000 mg by mouth 2 (two) times daily with a meal.  . Multiple Vitamin (MULTIVITAMIN) tablet Take 1 tablet by mouth daily.  Marland Kitchen  pioglitazone (ACTOS) 30 MG tablet Take 30 mg by mouth daily.  Marland Kitchen telmisartan-hydrochlorothiazide (MICARDIS HCT) 40-12.5 MG per tablet Take 1 tablet by mouth daily.    No results found for this or any previous visit (from the past 72 hour(s)).  No results found.     All questions at time of visit were answered - patient instructed to contact office with any additional concerns or updates.  ER/RTC precautions were reviewed with the patient as applicable.   Please note: voice recognition software was used to produce this document, and typos may escape review. Please contact Dr. Sheppard Coil for any needed clarifications.   Total encounter time: 60 minutes.

## 2020-06-22 ENCOUNTER — Telehealth: Payer: Self-pay

## 2020-06-22 MED ORDER — EMPAGLIFLOZIN 25 MG PO TABS: 25.0000 mg | ORAL_TABLET | Freq: Every day | ORAL | 1 refills | Status: DC

## 2020-06-22 NOTE — Telephone Encounter (Signed)
Patient called into office wanting to make sure we had the correct pharmacy to make sure we send Jardice to [facility] instead of [facility]. Pharmacy has been updated in the chart.

## 2020-06-26 DIAGNOSIS — R69 Illness, unspecified: Secondary | ICD-10-CM | POA: Diagnosis not present

## 2020-06-28 ENCOUNTER — Other Ambulatory Visit: Payer: Self-pay

## 2020-06-28 DIAGNOSIS — R69 Illness, unspecified: Secondary | ICD-10-CM | POA: Diagnosis not present

## 2020-06-28 MED ORDER — MICROLET LANCETS MISC: 99 refills | Status: DC

## 2020-06-28 NOTE — Telephone Encounter (Signed)
[facility] called requesting a new rx - microlet lancets. Rx pended.

## 2020-07-11 ENCOUNTER — Encounter: Payer: Self-pay | Admitting: Osteopathic Medicine

## 2020-08-23 DIAGNOSIS — R69 Illness, unspecified: Secondary | ICD-10-CM | POA: Diagnosis not present

## 2020-09-04 ENCOUNTER — Other Ambulatory Visit: Payer: Self-pay

## 2020-09-04 DIAGNOSIS — E559 Vitamin D deficiency, unspecified: Secondary | ICD-10-CM

## 2020-09-04 DIAGNOSIS — E119 Type 2 diabetes mellitus without complications: Secondary | ICD-10-CM

## 2020-09-04 DIAGNOSIS — C50919 Malignant neoplasm of unspecified site of unspecified female breast: Secondary | ICD-10-CM

## 2020-09-04 MED ORDER — METFORMIN HCL 500 MG PO TABS: 1000.0000 mg | ORAL_TABLET | Freq: Two times a day (BID) | ORAL | 3 refills | Status: DC

## 2020-09-05 DIAGNOSIS — Z853 Personal history of malignant neoplasm of breast: Secondary | ICD-10-CM | POA: Diagnosis not present

## 2020-09-05 DIAGNOSIS — K219 Gastro-esophageal reflux disease without esophagitis: Secondary | ICD-10-CM | POA: Diagnosis not present

## 2020-09-05 DIAGNOSIS — E1159 Type 2 diabetes mellitus with other circulatory complications: Secondary | ICD-10-CM | POA: Diagnosis not present

## 2020-09-05 DIAGNOSIS — E785 Hyperlipidemia, unspecified: Secondary | ICD-10-CM | POA: Diagnosis not present

## 2020-09-05 DIAGNOSIS — Z0000 Encounter for general adult medical examination without abnormal findings: Secondary | ICD-10-CM | POA: Diagnosis not present

## 2020-09-05 DIAGNOSIS — E1169 Type 2 diabetes mellitus with other specified complication: Secondary | ICD-10-CM | POA: Diagnosis not present

## 2020-09-05 DIAGNOSIS — I10 Essential (primary) hypertension: Secondary | ICD-10-CM | POA: Diagnosis not present

## 2020-09-06 DIAGNOSIS — H40013 Open angle with borderline findings, low risk, bilateral: Secondary | ICD-10-CM | POA: Diagnosis not present

## 2020-09-06 DIAGNOSIS — E119 Type 2 diabetes mellitus without complications: Secondary | ICD-10-CM | POA: Diagnosis not present

## 2020-09-06 DIAGNOSIS — H2513 Age-related nuclear cataract, bilateral: Secondary | ICD-10-CM | POA: Diagnosis not present

## 2020-09-06 LAB — CBC
HCT: 39.1 % (ref 35.0–45.0)
Hemoglobin: 12.3 g/dL (ref 11.7–15.5)
MCH: 27.8 pg (ref 27.0–33.0)
MCHC: 31.5 g/dL — ABNORMAL LOW (ref 32.0–36.0)
MCV: 88.5 fL (ref 80.0–100.0)
MPV: 11.2 fL (ref 7.5–12.5)
Platelets: 276 10*3/uL (ref 140–400)
RBC: 4.42 10*6/uL (ref 3.80–5.10)
RDW: 12.9 % (ref 11.0–15.0)
WBC: 5.4 10*3/uL (ref 3.8–10.8)

## 2020-09-06 LAB — COMPLETE METABOLIC PANEL WITH GFR
AG Ratio: 1.8 (calc) (ref 1.0–2.5)
ALT: 19 U/L (ref 6–29)
AST: 17 U/L (ref 10–35)
Albumin: 4.4 g/dL (ref 3.6–5.1)
Alkaline phosphatase (APISO): 54 U/L (ref 37–153)
BUN: 18 mg/dL (ref 7–25)
CO2: 25 mmol/L (ref 20–32)
Calcium: 9.2 mg/dL (ref 8.6–10.4)
Chloride: 98 mmol/L (ref 98–110)
Creat: 0.86 mg/dL (ref 0.50–0.99)
GFR, Est African American: 82 mL/min/{1.73_m2} (ref >=60)
GFR, Est Non African American: 70 mL/min/{1.73_m2} (ref >=60)
Globulin: 2.4 g/dL (calc) (ref 1.9–3.7)
Glucose, Bld: 243 mg/dL — ABNORMAL HIGH (ref 65–99)
Potassium: 4.3 mmol/L (ref 3.5–5.3)
Sodium: 135 mmol/L (ref 135–146)
Total Bilirubin: 0.4 mg/dL (ref 0.2–1.2)
Total Protein: 6.8 g/dL (ref 6.1–8.1)

## 2020-09-06 LAB — HEMOGLOBIN A1C
Hgb A1c MFr Bld: 10.2 % of total Hgb — ABNORMAL HIGH (ref <5.7)
Mean Plasma Glucose: 246 (calc)
eAG (mmol/L): 13.6 (calc)

## 2020-09-06 LAB — LIPID PANEL
Cholesterol: 135 mg/dL (ref <200)
HDL: 53 mg/dL (ref >=50)
LDL Cholesterol (Calc): 59 mg/dL (calc)
Non-HDL Cholesterol (Calc): 82 mg/dL (calc) (ref <130)
Total CHOL/HDL Ratio: 2.5 (calc) (ref <5.0)
Triglycerides: 149 mg/dL (ref <150)

## 2020-09-11 ENCOUNTER — Ambulatory Visit (INDEPENDENT_AMBULATORY_CARE_PROVIDER_SITE_OTHER): Payer: Medicare HMO | Admitting: Osteopathic Medicine

## 2020-09-11 ENCOUNTER — Encounter: Payer: Self-pay | Admitting: Osteopathic Medicine

## 2020-09-11 ENCOUNTER — Encounter (INDEPENDENT_AMBULATORY_CARE_PROVIDER_SITE_OTHER): Payer: Self-pay

## 2020-09-11 VITALS — BP 118/75 | HR 115 | Temp 98.0°F | Wt 207.0 lb

## 2020-09-11 DIAGNOSIS — E119 Type 2 diabetes mellitus without complications: Secondary | ICD-10-CM | POA: Diagnosis not present

## 2020-09-11 DIAGNOSIS — E559 Vitamin D deficiency, unspecified: Secondary | ICD-10-CM

## 2020-09-11 DIAGNOSIS — E1159 Type 2 diabetes mellitus with other circulatory complications: Secondary | ICD-10-CM

## 2020-09-11 DIAGNOSIS — I10 Essential (primary) hypertension: Secondary | ICD-10-CM | POA: Diagnosis not present

## 2020-09-11 MED ORDER — GLIMEPIRIDE 4 MG PO TABS: 4.0000 mg | ORAL_TABLET | Freq: Two times a day (BID) | ORAL | 1 refills | Status: DC

## 2020-09-11 NOTE — Patient Instructions (Signed)
Plan: Continue all meds Will add back Glimepiride 4 mg twice daily (take once daily for one week)  Keep track of fasting sugars for 2 weeks or so Will call you in a few weeks to review sugars  Let's recheck A1C in 3 months

## 2020-09-11 NOTE — Progress Notes (Signed)
Shaquoia Miers is a 64 y.o. female who presents to  [place] at [facility]  today, 09/11/20, seeking care for the following:  . Recheck diabetes      ASSESSMENT & PLAN with other pertinent findings:  The primary encounter diagnosis was Diabetes mellitus without complication ([place]). Diagnoses of Hypertension associated with diabetes ([place]) and Vitamin D deficiency were also pertinent to this visit.   1. Diabetes mellitus without complication (HCC) H8N significantly worse from previous despite excellent adherence to diet/exercise and Rx. Pt ok to add back glimepiride but I think we would be wise to add GLP1 or consider starting insulin. Extended discussion of DM2 natural history, management options  2. Hypertension associated with diabetes ([place]) B under good control  3. Vitamin D deficiency Pt asked about supplementation, ok to continue 2000 units daily     No results found for this or any previous visit (from the past 24 hour(s)).   There are no Patient Instructions on file for this visit.  No orders of the defined types were placed in this encounter.   No orders of the defined types were placed in this encounter.      Follow-up instructions: No follow-ups on file.                                         There were no vitals taken for this visit.  No outpatient medications have been marked as taking for the 09/11/20 encounter (Appointment) with Emeterio Reeve, DO.    No results found for this or any previous visit (from the past 72 hour(s)).  No results found.     All questions at time of visit were answered - patient instructed to contact office with any additional concerns or updates.  ER/RTC precautions were reviewed with the patient as applicable.   Please note: voice recognition software was used to produce this document, and typos may escape review. Please contact Dr.  Sheppard Coil for any needed clarifications.   Total encounter time: 40 minutes.

## 2020-09-28 DIAGNOSIS — R69 Illness, unspecified: Secondary | ICD-10-CM | POA: Diagnosis not present

## 2020-11-14 DIAGNOSIS — R69 Illness, unspecified: Secondary | ICD-10-CM | POA: Diagnosis not present

## 2020-11-16 DIAGNOSIS — R69 Illness, unspecified: Secondary | ICD-10-CM | POA: Diagnosis not present

## 2020-11-26 ENCOUNTER — Other Ambulatory Visit: Payer: Self-pay | Admitting: Osteopathic Medicine

## 2020-11-26 DIAGNOSIS — E119 Type 2 diabetes mellitus without complications: Secondary | ICD-10-CM

## 2020-12-11 ENCOUNTER — Encounter: Payer: Self-pay | Admitting: Osteopathic Medicine

## 2020-12-11 ENCOUNTER — Other Ambulatory Visit: Payer: Self-pay

## 2020-12-11 ENCOUNTER — Ambulatory Visit (INDEPENDENT_AMBULATORY_CARE_PROVIDER_SITE_OTHER): Payer: Medicare HMO | Admitting: Osteopathic Medicine

## 2020-12-11 VITALS — BP 118/58 | HR 102 | Temp 98.1°F | Wt 209.7 lb

## 2020-12-11 DIAGNOSIS — E119 Type 2 diabetes mellitus without complications: Secondary | ICD-10-CM | POA: Diagnosis not present

## 2020-12-11 DIAGNOSIS — E1159 Type 2 diabetes mellitus with other circulatory complications: Secondary | ICD-10-CM | POA: Diagnosis not present

## 2020-12-11 DIAGNOSIS — I152 Hypertension secondary to endocrine disorders: Secondary | ICD-10-CM | POA: Diagnosis not present

## 2020-12-11 DIAGNOSIS — E1169 Type 2 diabetes mellitus with other specified complication: Secondary | ICD-10-CM

## 2020-12-11 DIAGNOSIS — E785 Hyperlipidemia, unspecified: Secondary | ICD-10-CM

## 2020-12-11 DIAGNOSIS — E559 Vitamin D deficiency, unspecified: Secondary | ICD-10-CM | POA: Diagnosis not present

## 2020-12-11 LAB — POCT GLYCOSYLATED HEMOGLOBIN (HGB A1C): Hemoglobin A1C: 8 % — AB (ref 4.0–5.6)

## 2020-12-11 NOTE — Progress Notes (Signed)
Mary [name] is a 65 y.o. female who presents to  [facility] at [facility]  today, 12/11/20, seeking care for the following:   Follow-up DM2, HTN, HLD    ASSESSMENT & PLAN with other pertinent findings:  The primary encounter diagnosis was Diabetes mellitus without complication (HCC). Diagnoses of Hyperlipidemia associated with type 2 diabetes mellitus (HCC) and Hypertension associated with diabetes (HCC) were also pertinent to this visit.   Detailed discussion of A1C, sugars, recommended medications, NH of DM2, medication options for treatment, lifestyle modifications  Recent Results (from the past 2160 hour(s))  POCT glycosylated hemoglobin (Hb A1C)     Status: Abnormal   Collection Time: 12/11/20  8:25 AM  Result Value Ref Range   Hemoglobin A1C 8.0 (A) 4.0 - 5.6 %   HbA1c POC (<> result, manual entry)     HbA1c, POC (prediabetic range)     HbA1c, POC (controlled diabetic range)      There are no Patient Instructions on file for this visit.  Orders Placed This Encounter  Procedures  . POCT glycosylated hemoglobin (Hb A1C)    No orders of the defined types were placed in this encounter.      Follow-up instructions: No follow-ups on file.                                         BP (!) 118/58   Pulse (!) 102   Temp 98.1 F (36.7 C)   Wt 209 lb 11.2 oz (95.1 kg)   SpO2 98%   BMI 39.62 kg/m   Current Meds  Medication Sig  . aspirin 81 MG tablet Take 81 mg by mouth daily.  Marland Kitchen atorvastatin (LIPITOR) 10 MG tablet Take 10 mg by mouth daily.  . Cholecalciferol (VITAMIN D3) 2000 UNITS TABS Take 1 tablet by mouth 2 (two) times daily.   . empagliflozin (JARDIANCE) 25 MG TABS tablet Take 1 tablet (25 mg total) by mouth daily before breakfast.  . esomeprazole (NEXIUM) 40 MG capsule Take 40 mg by mouth daily before breakfast.  . glimepiride (AMARYL) 4 MG tablet Take 1 tablet (4 mg total) by  mouth 2 (two) times daily.  . metFORMIN (GLUCOPHAGE) 500 MG tablet TAKE TWO TABLETS BY MOUTH TWICE A DAY WITH A MEAL  . Multiple Vitamin (MULTIVITAMIN) tablet Take 1 tablet by mouth daily.  Letta Pate VERIO test strip SMARTSIG:Via Meter  . pioglitazone (ACTOS) 30 MG tablet Take 30 mg by mouth daily.  Marland Kitchen telmisartan-hydrochlorothiazide (MICARDIS HCT) 40-12.5 MG per tablet Take 1 tablet by mouth daily.    Results for orders placed or performed in visit on 12/11/20 (from the past 72 hour(s))  POCT glycosylated hemoglobin (Hb A1C)     Status: Abnormal   Collection Time: 12/11/20  8:25 AM  Result Value Ref Range   Hemoglobin A1C 8.0 (A) 4.0 - 5.6 %   HbA1c POC (<> result, manual entry)     HbA1c, POC (prediabetic range)     HbA1c, POC (controlled diabetic range)      No results found.     All questions at time of visit were answered - patient instructed to contact office with any additional concerns or updates.  ER/RTC precautions were reviewed with the patient as applicable.   Please note: voice recognition software was used to produce this document, and typos may escape review. Please  contact Dr. Sheppard Coil for any needed clarifications.   Total encounter time: 40 minutes counseling and coordinating care.

## 2020-12-12 MED ORDER — PIOGLITAZONE HCL 30 MG PO TABS: 30.0000 mg | ORAL_TABLET | Freq: Every day | ORAL | 3 refills | Status: DC

## 2020-12-12 MED ORDER — ESOMEPRAZOLE MAGNESIUM 40 MG PO CPDR: 40.0000 mg | DELAYED_RELEASE_CAPSULE | Freq: Every day | ORAL | 3 refills | Status: DC

## 2020-12-12 MED ORDER — GLIMEPIRIDE 4 MG PO TABS: 4.0000 mg | ORAL_TABLET | Freq: Two times a day (BID) | ORAL | 3 refills | Status: DC

## 2020-12-12 MED ORDER — ATORVASTATIN CALCIUM 10 MG PO TABS: 10.0000 mg | ORAL_TABLET | Freq: Every day | ORAL | 3 refills | Status: DC

## 2020-12-12 MED ORDER — EMPAGLIFLOZIN 25 MG PO TABS: 25.0000 mg | ORAL_TABLET | Freq: Every day | ORAL | 3 refills | Status: DC

## 2020-12-12 MED ORDER — TELMISARTAN-HCTZ 40-12.5 MG PO TABS: 1.0000 | ORAL_TABLET | Freq: Every day | ORAL | 3 refills | Status: DC

## 2021-01-04 IMAGING — MG DIGITAL SCREENING BILAT W/ TOMO W/ CAD
8 series · 8 of 24 positions shown · non-contrast
Comparison: Previous exam(s).

CLINICAL DATA: Screening.

EXAM:
DIGITAL SCREENING BILATERAL MAMMOGRAM WITH TOMO AND CAD

[R CC synth-2D]
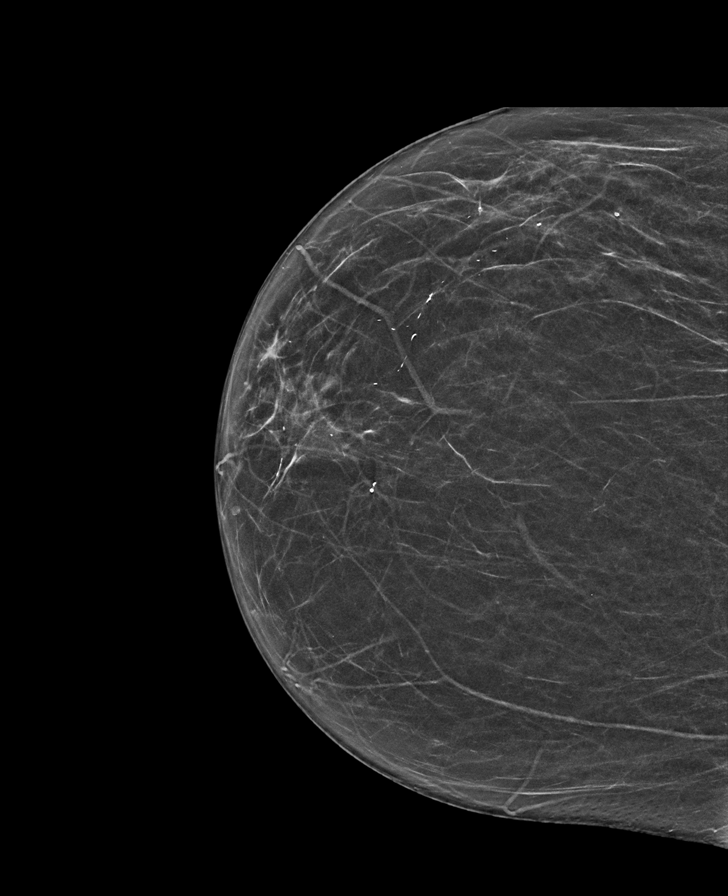

[L CC synth-2D]
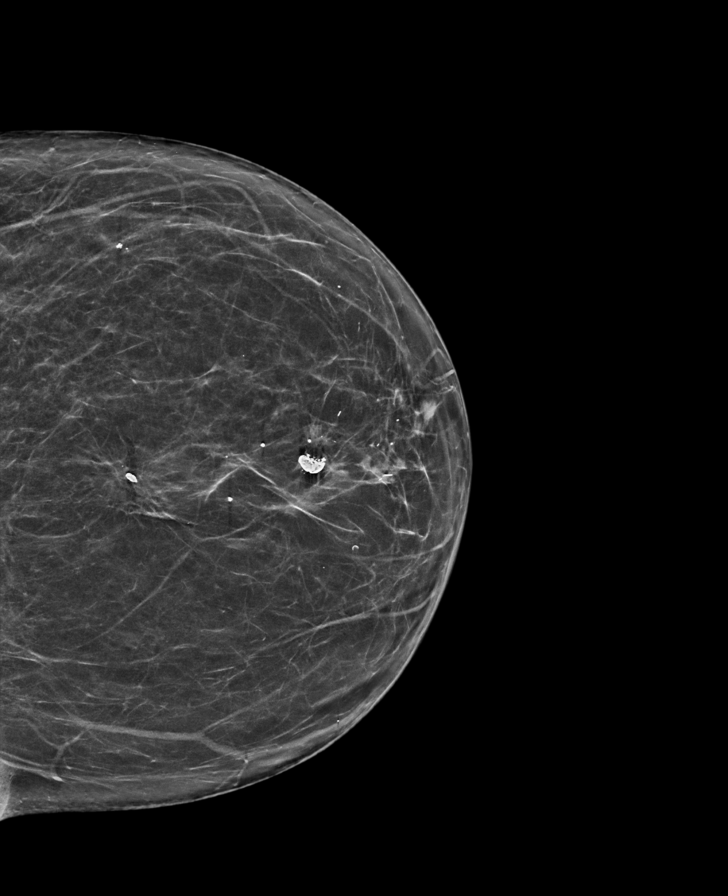

[R MLO synth-2D]
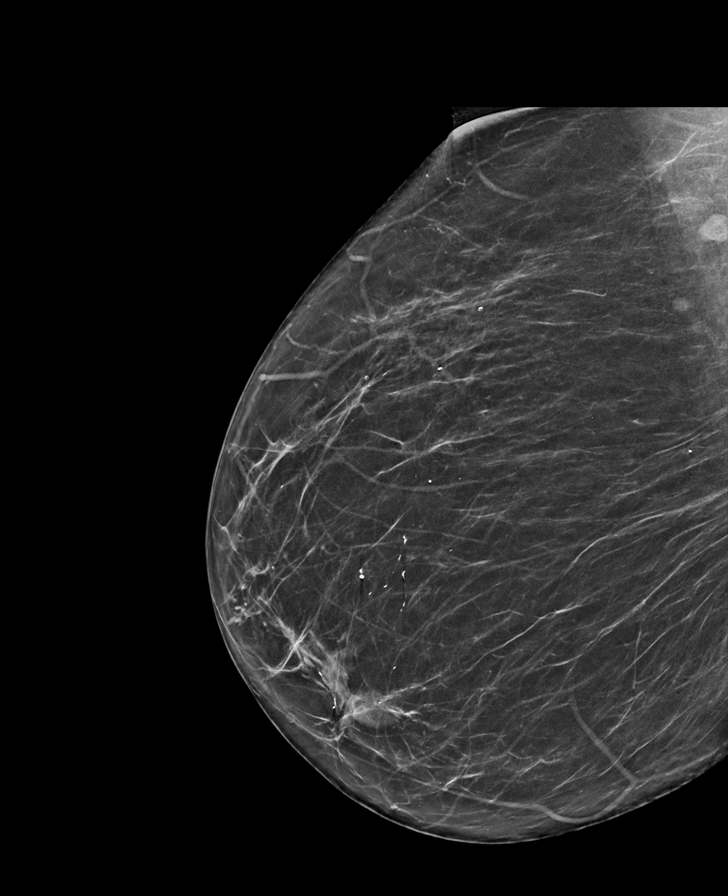

[L MLO synth-2D]
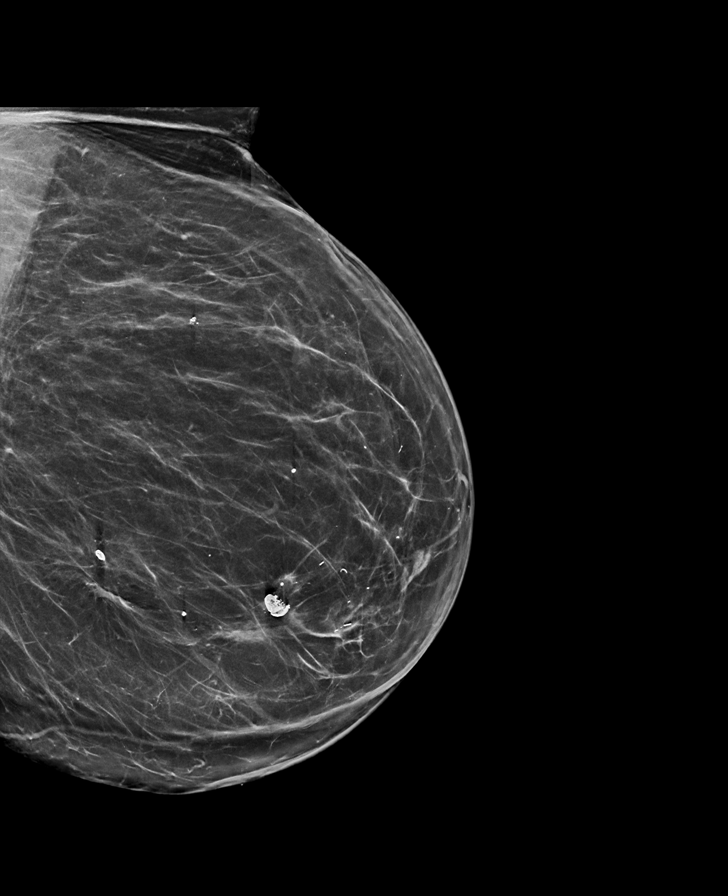

[R MLO tomo · tomo slice 36/71.0]
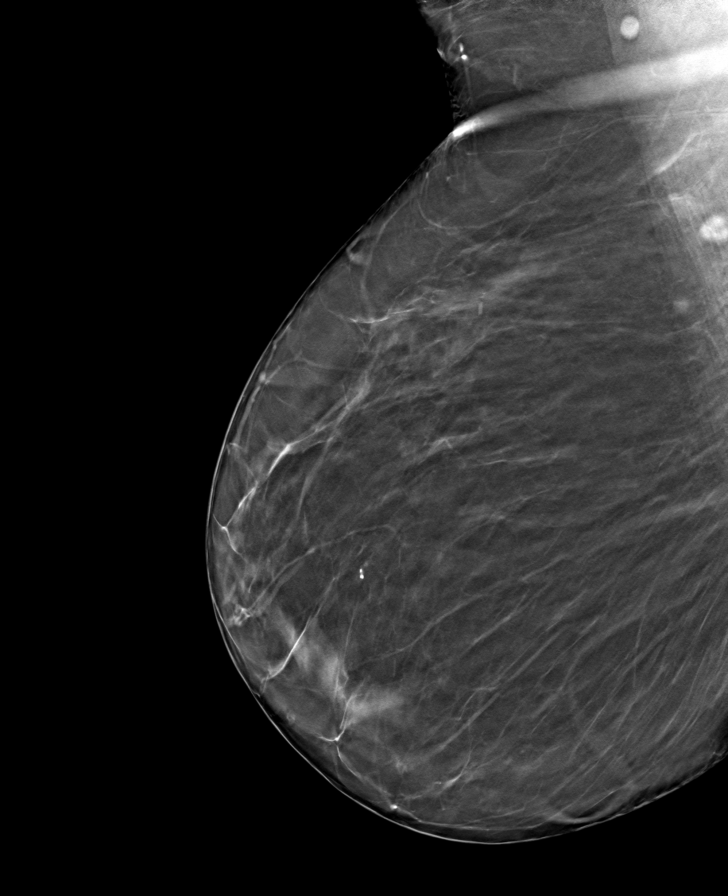

[L CC tomo · tomo slice 35/68.0]
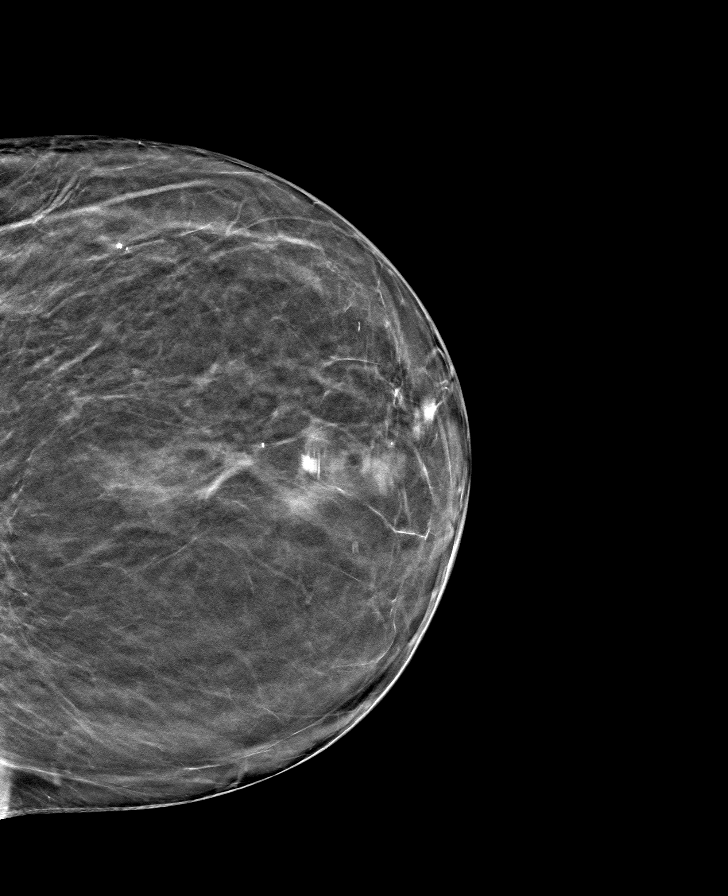

[L MLO tomo · tomo slice 39/77.0]
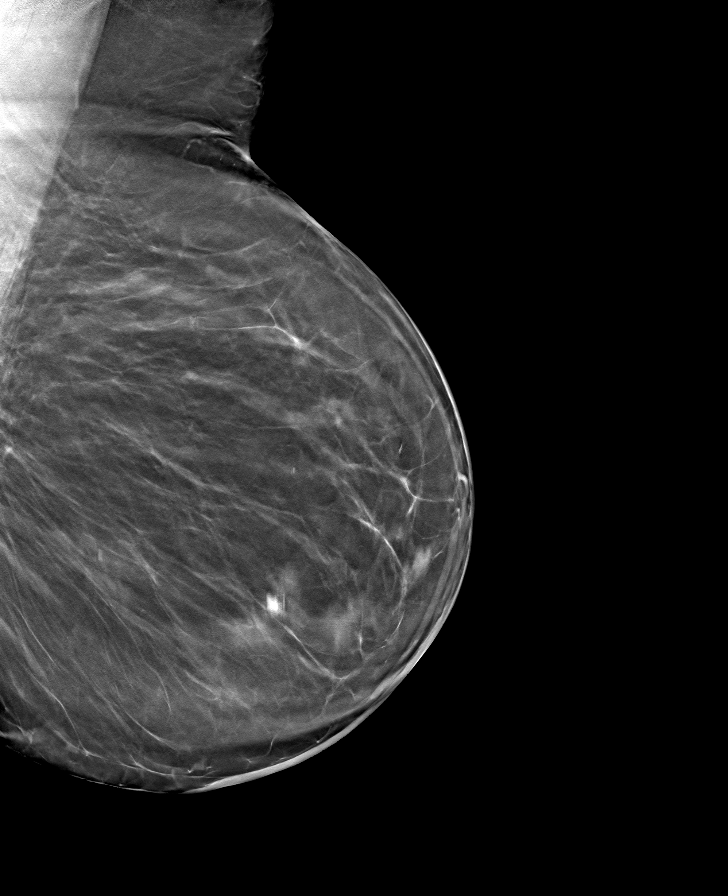

[R CC tomo · tomo slice 29/57.0]
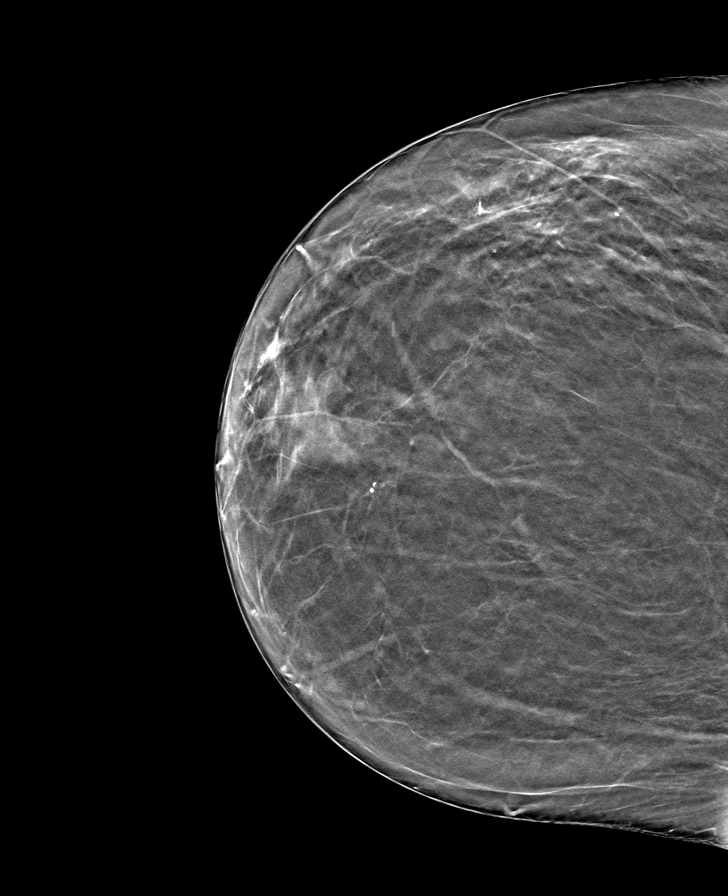

[8 of 24 positions shown; findings below may reference images not displayed]

ACR Breast Density Category b: There are scattered areas of
fibroglandular density.
FINDINGS: There are no findings suspicious for malignancy. Images were
processed with CAD.
IMPRESSION: No mammographic evidence of malignancy. A result letter of this
screening mammogram will be mailed directly to the patient.

RECOMMENDATION:
Screening mammogram in one year. (Code:[id number])

BI-RADS CATEGORY  1: Negative.

## 2021-01-14 ENCOUNTER — Other Ambulatory Visit: Payer: Self-pay

## 2021-01-14 DIAGNOSIS — E119 Type 2 diabetes mellitus without complications: Secondary | ICD-10-CM

## 2021-01-14 MED ORDER — ONETOUCH VERIO VI STRP
ORAL_STRIP | 99 refills | Status: DC
Start: 2021-01-14 — End: 2021-03-07

## 2021-01-14 NOTE — Telephone Encounter (Signed)
Routing to covering provider.   Kristopher Oppenheim requesting 90 d/s for [employer] test strips. Rx written by historical provider.

## 2021-03-06 ENCOUNTER — Telehealth: Payer: Self-pay

## 2021-03-06 DIAGNOSIS — E119 Type 2 diabetes mellitus without complications: Secondary | ICD-10-CM

## 2021-03-06 NOTE — Telephone Encounter (Signed)
[facility] requesting med refills for [employer] test strip. Rx not on active med list.

## 2021-03-07 MED ORDER — ONETOUCH VERIO VI STRP: ORAL_STRIP | 99 refills | Status: DC

## 2021-04-01 DIAGNOSIS — H40013 Open angle with borderline findings, low risk, bilateral: Secondary | ICD-10-CM | POA: Diagnosis not present

## 2021-04-01 DIAGNOSIS — H2513 Age-related nuclear cataract, bilateral: Secondary | ICD-10-CM | POA: Diagnosis not present

## 2021-04-01 DIAGNOSIS — E119 Type 2 diabetes mellitus without complications: Secondary | ICD-10-CM | POA: Diagnosis not present

## 2021-04-01 DIAGNOSIS — H35033 Hypertensive retinopathy, bilateral: Secondary | ICD-10-CM | POA: Diagnosis not present

## 2021-04-01 LAB — HM DIABETES EYE EXAM

## 2021-04-10 ENCOUNTER — Encounter: Payer: Self-pay | Admitting: Osteopathic Medicine

## 2021-04-16 ENCOUNTER — Ambulatory Visit: Payer: Medicare HMO | Admitting: Osteopathic Medicine

## 2021-04-18 ENCOUNTER — Other Ambulatory Visit: Payer: Self-pay

## 2021-04-18 ENCOUNTER — Encounter: Payer: Self-pay | Admitting: Osteopathic Medicine

## 2021-04-18 ENCOUNTER — Ambulatory Visit (INDEPENDENT_AMBULATORY_CARE_PROVIDER_SITE_OTHER): Payer: Medicare HMO | Admitting: Osteopathic Medicine

## 2021-04-18 VITALS — BP 127/69 | HR 101 | Temp 97.6°F | Wt 210.0 lb

## 2021-04-18 DIAGNOSIS — E119 Type 2 diabetes mellitus without complications: Secondary | ICD-10-CM | POA: Diagnosis not present

## 2021-04-18 LAB — POCT GLYCOSYLATED HEMOGLOBIN (HGB A1C): Hemoglobin A1C: 7.3 % — AB (ref 4.0–5.6)

## 2021-04-18 NOTE — Progress Notes (Signed)
Mary [name] is a 65 y.o. female who presents to  [place] at [facility]  today, 04/18/21, seeking care for the following:  . DM2 follow-up  o Current Rx - Jardiance 25 mg daily  - Glimepiride 4 mg bid  - Metfromin 1000 mg bid  - Pioglitazone 30 mg daily - Other: ASA, Statin, ARB o Pt reports doing well, pleased w/ A1C progress   04/18/21 7.3 Today: not quite at goal <7 but better  12/11/20 8.0 --> no change Rx eds, pt to work on diet/exercise 09/05/20 10.2 --> added back glimepiride, discussed other Rx  BP Readings from Last 3 Encounters:  04/18/21 127/69  12/11/20 (!) 118/58  09/11/20 118/75   Wt Readings from Last 3 Encounters:  04/18/21 210 lb (95.3 kg)  12/11/20 209 lb 11.2 oz (95.1 kg)  09/11/20 207 lb (93.9 kg)       ASSESSMENT & PLAN with other pertinent findings:  The encounter diagnosis was Diabetes mellitus without complication ([place]).   --> continue current medications  There are no Patient Instructions on file for this visit.  Orders Placed This Encounter  Procedures  . POCT HgB A1C    No orders of the defined types were placed in this encounter.    See below for relevant physical exam findings  See below for recent lab and imaging results reviewed  Medications, allergies, PMH, PSH, SocH, FamH reviewed below    Follow-up instructions: Return in about 4 months (around 08/18/2021) for MONITOR A1C .                                        Exam:  BP 127/69 (BP Location: Left Arm, Patient Position: Sitting, Cuff Size: Normal)   Pulse (!) 101   Temp 97.6 F (36.4 C) (Oral)   Wt 210 lb (95.3 kg)   BMI 39.68 kg/m   Constitutional: VS see above. General Appearance: alert, well-developed, well-nourished, NAD  Neck: No masses, trachea midline.   Respiratory: Normal respiratory effort. no wheeze, no rhonchi, no rales  Cardiovascular: S1/S2 normal, no murmur, no  rub/gallop auscultated. RRR.   Musculoskeletal: Gait normal. Symmetric and independent movement of all extremities  Neurological: Normal balance/coordination. No tremor.  Skin: warm, dry, intact.   Psychiatric: Normal judgment/insight. Normal mood and affect. Oriented x3.   Current Meds  Medication Sig  . aspirin 81 MG tablet Take 81 mg by mouth daily.  Marland Kitchen atorvastatin (LIPITOR) 10 MG tablet Take 1 tablet (10 mg total) by mouth daily.  . Cholecalciferol (VITAMIN D3) 2000 UNITS TABS Take 1 tablet by mouth 2 (two) times daily.   . empagliflozin (JARDIANCE) 25 MG TABS tablet Take 1 tablet (25 mg total) by mouth daily before breakfast.  . esomeprazole (NEXIUM) 40 MG capsule Take 1 capsule (40 mg total) by mouth daily.  Marland Kitchen glimepiride (AMARYL) 4 MG tablet Take 1 tablet (4 mg total) by mouth 2 (two) times daily.  Marland Kitchen glucose blood (ONETOUCH VERIO) test strip Use to monitor blood sugar three times a day. Dx E11.9  . metFORMIN (GLUCOPHAGE) 500 MG tablet TAKE TWO TABLETS BY MOUTH TWICE A DAY WITH A MEAL  . Multiple Vitamin (MULTIVITAMIN) tablet Take 1 tablet by mouth daily.  . pioglitazone (ACTOS) 30 MG tablet Take 1 tablet (30 mg total) by mouth daily.  Marland Kitchen telmisartan-hydrochlorothiazide (MICARDIS HCT) 40-12.5 MG tablet Take 1 tablet  by mouth daily.    Allergies  Allergen Reactions  . Codeine Nausea And Vomiting    Patient Active Problem List   Diagnosis Date Noted  . Breast cancer ([place]) 06/04/2012    Family History  Problem Relation Age of Onset  . Heart attack Mother   . Hypertension Mother   . Diabetes Mother   . Cancer Father        Prostate cancer  . Hypertension Brother     Social History   Tobacco Use  Smoking Status Former Smoker  Smokeless Tobacco Never Used  Tobacco Comment   Quit smoking in 1990    Past Surgical History:  Procedure Laterality Date  . ABDOMINAL HYSTERECTOMY  11/1998  . BREAST LUMPECTOMY WITH AXILLARY LYMPH NODE BIOPSY Left    Lumpectomy in  11/2008 and axillary biopsy in 12/2008  . DILATION AND CURETTAGE OF UTERUS  08/1998    Immunization History  Administered Date(s) Administered  . PFIZER(Purple Top)SARS-COV-2 Vaccination 05/12/2020, 06/02/2020    Recent Results (from the past 2160 hour(s))  HM DIABETES EYE EXAM     Status: None   Collection Time: 04/01/21 12:00 AM  Result Value Ref Range   HM Diabetic Eye Exam No Retinopathy No Retinopathy  POCT HgB A1C     Status: Abnormal   Collection Time: 04/18/21  7:55 AM  Result Value Ref Range   Hemoglobin A1C 7.3 (A) 4.0 - 5.6 %   HbA1c POC (<> result, manual entry)     HbA1c, POC (prediabetic range)     HbA1c, POC (controlled diabetic range)      No results found.     All questions at time of visit were answered - patient instructed to contact office with any additional concerns or updates. ER/RTC precautions were reviewed with the patient as applicable.   Please note: manual typing as well as voice recognition software may have been used to produce this document - typos may escape review. Please contact Dr. Sheppard Coil for any needed clarifications.  \

## 2021-05-23 ENCOUNTER — Ambulatory Visit (INDEPENDENT_AMBULATORY_CARE_PROVIDER_SITE_OTHER): Payer: Medicare HMO | Admitting: Osteopathic Medicine

## 2021-05-23 ENCOUNTER — Ambulatory Visit: Payer: Medicare HMO

## 2021-05-23 DIAGNOSIS — Z0000 Encounter for general adult medical examination without abnormal findings: Secondary | ICD-10-CM | POA: Diagnosis not present

## 2021-05-23 NOTE — Progress Notes (Signed)
MEDICARE ANNUAL WELLNESS VISIT  05/23/2021  Telephone Visit Disclaimer This Medicare AWV was conducted by telephone due to national recommendations for restrictions regarding the COVID-19 Pandemic (e.g. social distancing).  I verified, using two identifiers, that I am speaking with Mary [name] or their authorized healthcare agent. I discussed the limitations, risks, security, and privacy concerns of performing an evaluation and management service by telephone and the potential availability of an in-person appointment in the future. The patient expressed understanding and agreed to proceed.  Location of Patient: Home Location of Provider (nurse):  Provider Home  Subjective:    Mary [name] is a 65 y.o. female patient of Emeterio Reeve, DO who had a Medicare Annual Wellness Visit today via telephone. Mary [name] is Retired and lives alone. she has 0 children. she reports that she is socially active and does interact with friends/family regularly. she is moderately physically active and enjoys yardwork, crossword puzzles and sliver sneakers.  Patient Care Team: Emeterio Reeve, DO as PCP - General (Osteopathic Medicine)  Advanced Directives 05/23/2021  Does Patient Have a Medical Advance Directive? No  Would patient like information on creating a medical advance directive? No - Patient declined    Hospital Utilization Over the Past 12 Months: # of hospitalizations or ER visits: 0 # of surgeries: 0  Review of Systems    Patient reports that her overall health is better compared to last year.  History obtained from chart review and the patient  Patient Reported Readings (BP, Pulse, CBG, Weight, etc) none  Pain Assessment Pain : No/denies pain     Current Medications & Allergies (verified) Allergies as of 05/23/2021      Reactions   Codeine Nausea And Vomiting      Medication List       Accurate as of May 23, 2021  2:32 PM. If you have any questions, ask your nurse or  doctor.        aspirin 81 MG tablet Take 81 mg by mouth daily.   atorvastatin 10 MG tablet Commonly known as: LIPITOR Take 1 tablet (10 mg total) by mouth daily.   empagliflozin 25 MG Tabs tablet Commonly known as: Jardiance Take 1 tablet (25 mg total) by mouth daily before breakfast.   esomeprazole 40 MG capsule Commonly known as: NEXIUM Take 1 capsule (40 mg total) by mouth daily.   glimepiride 4 MG tablet Commonly known as: AMARYL Take 1 tablet (4 mg total) by mouth 2 (two) times daily.   metFORMIN 500 MG tablet Commonly known as: GLUCOPHAGE TAKE TWO TABLETS BY MOUTH TWICE A DAY WITH A MEAL   Microlet Lancets Misc   multivitamin tablet Take 1 tablet by mouth daily.   OneTouch Verio test strip Generic drug: glucose blood Use to monitor blood sugar three times a day. Dx E11.9   pioglitazone 30 MG tablet Commonly known as: ACTOS Take 1 tablet (30 mg total) by mouth daily.   telmisartan-hydrochlorothiazide 40-12.5 MG tablet Commonly known as: MICARDIS HCT Take 1 tablet by mouth daily.   Vitamin D3 50 MCG (2000 UT) Tabs Take 1 tablet by mouth 2 (two) times daily.       History (reviewed): Past Medical History:  Diagnosis Date  . Breast cancer ([place]) 10/2008   Left breast invasive ductal carcinoma  . Diabetes mellitus without complication ([place])   . GERD (gastroesophageal reflux disease)   . Hyperlipidemia   . Hypertension   . Osteopenia    Past Surgical History:  Procedure Laterality Date  .  ABDOMINAL HYSTERECTOMY  11/1998  . BREAST LUMPECTOMY WITH AXILLARY LYMPH NODE BIOPSY Left    Lumpectomy in 11/2008 and axillary biopsy in 12/2008  . DILATION AND CURETTAGE OF UTERUS  08/1998   Family History  Problem Relation Age of Onset  . Heart attack Mother   . Hypertension Mother   . Diabetes Mother   . Cancer Father        Prostate cancer  . Heart disease Father   . Hypertension Brother    Social History   Socioeconomic History  . Marital status:  Divorced    Spouse name: Not on file  . Number of children: 0  . Years of education: 30  . Highest education level: Some college, no degree  Occupational History    Comment: Retired  Tobacco Use  . Smoking status: Former Research scientist (life sciences)  . Smokeless tobacco: Never Used  . Tobacco comment: Quit smoking in 1990  Substance and Sexual Activity  . Alcohol use: No  . Drug use: No  . Sexual activity: Never    Birth control/protection: Surgical, Post-menopausal  Other Topics Concern  . Not on file  Social History Narrative   Lives alone. Enjoys reading, doing yard work, church activities, silver sneakers and doing crossword puzzles.   Social Determinants of Health   Financial Resource Strain: Low Risk   . Difficulty of Paying Living Expenses: Not hard at all  Food Insecurity: No Food Insecurity  . Worried About Charity fundraiser in the Last Year: Never true  . Ran Out of Food in the Last Year: Never true  Transportation Needs: No Transportation Needs  . Lack of Transportation (Medical): No  . Lack of Transportation (Non-Medical): No  Physical Activity: Sufficiently Active  . Days of Exercise per Week: 3 days  . Minutes of Exercise per Session: 50 min  Stress: No Stress Concern Present  . Feeling of Stress : Not at all  Social Connections: Moderately Integrated  . Frequency of Communication with Friends and Family: More than three times a week  . Frequency of Social Gatherings with Friends and Family: Twice a week  . Attends Religious Services: More than 4 times per year  . Active Member of Clubs or Organizations: Yes  . Attends Archivist Meetings: More than 4 times per year  . Marital Status: Divorced    Activities of Daily Living In your present state of health, do you have any difficulty performing the following activities: 05/23/2021  Hearing? N  Vision? N  Difficulty concentrating or making decisions? N  Walking or climbing stairs? N  Dressing or bathing? N  Doing  errands, shopping? N  Preparing Food and eating ? N  Using the Toilet? N  In the past six months, have you accidently leaked urine? N  Do you have problems with loss of bowel control? N  Managing your Medications? N  Managing your Finances? N  Housekeeping or managing your Housekeeping? N  Some recent data might be hidden    Patient Education/ Literacy How often do you need to have someone help you when you read instructions, pamphlets, or other written materials from your doctor or pharmacy?: 1 - Never What is the last grade level you completed in school?: 12th grade  Exercise Current Exercise Habits: Structured exercise class, Type of exercise: exercise ball;stretching, Time (Minutes): 50, Frequency (Times/Week): 3, Weekly Exercise (Minutes/Week): 150, Intensity: Moderate, Exercise limited by: None identified  Diet Patient reports consuming 3 meals a day and 2 snack(s)  a day Patient reports that her primary diet is: Regular Patient reports that she does have regular access to food.   Depression Screen PHQ 2/9 Scores 05/23/2021 09/11/2020 06/21/2020  PHQ - 2 Score 0 0 0  PHQ- 9 Score - - 0     Fall Risk No flowsheet data found.   Objective:  Mary [name] seemed alert and oriented and she participated appropriately during our telephone visit.  Blood Pressure Weight BMI  BP Readings from Last 3 Encounters:  04/18/21 127/69  12/11/20 (!) 118/58  09/11/20 118/75   Wt Readings from Last 3 Encounters:  04/18/21 210 lb (95.3 kg)  12/11/20 209 lb 11.2 oz (95.1 kg)  09/11/20 207 lb (93.9 kg)   BMI Readings from Last 1 Encounters:  04/18/21 39.68 kg/m    *Unable to obtain current vital signs, weight, and BMI due to telephone visit type  Hearing/Vision  . Winefred did not seem to have difficulty with hearing/understanding during the telephone conversation . Reports that she has had a formal eye exam by an eye care professional within the past year . Reports that she has not had a  formal hearing evaluation within the past year *Unable to fully assess hearing and vision during telephone visit type  Cognitive Function: 6CIT Screen 05/23/2021  What Year? 0 points  What month? 0 points  What time? 0 points  Count back from 20 0 points  Months in reverse 0 points  Repeat phrase 0 points  Total Score 0   (Normal:0-7, Significant for Dysfunction: >8)  Normal Cognitive Function Screening: Yes   Immunization & Health Maintenance Record Immunization History  Administered Date(s) Administered  . PFIZER(Purple Top)SARS-COV-2 Vaccination 05/12/2020, 06/02/2020    Health Maintenance  Topic Date Due  . Hepatitis C Screening  Never done  . Zoster Vaccines- Shingrix (1 of 2) Never done  . COLONOSCOPY (Pts 45-35yrs Insurance coverage will need to be confirmed)  04/10/2014  . COVID-19 Vaccine (3 - Pfizer risk 4-dose series) 06/30/2020  . TETANUS/TDAP  04/18/2022 (Originally 03/29/1975)  . PNA vac Low Risk Adult (1 of 2 - PCV13) 04/18/2022 (Originally 03/30/2019)  . INFLUENZA VACCINE  07/22/2021  . MAMMOGRAM  06/01/2022  . DEXA SCAN  Completed  . HPV VACCINES  Aged Out       Assessment  This is a routine wellness examination for Aleesia Henney.  Health Maintenance: Due or Overdue Health Maintenance Due  Topic Date Due  . Hepatitis C Screening  Never done  . Zoster Vaccines- Shingrix (1 of 2) Never done  . COLONOSCOPY (Pts 45-54yrs Insurance coverage will need to be confirmed)  04/10/2014  . COVID-19 Vaccine (3 - Pfizer risk 4-dose series) 06/30/2020    Mary [name] does not need a referral for Community Assistance: Care Management:   no Social Work:    no Prescription Assistance:  no Nutrition/Diabetes Education:  no   Plan:  Personalized Goals Goals Addressed              This Visit's Progress   .  Patient Stated (pt-stated)        05/23/2021 AWV Goal: Exercise for General Health   Patient will verbalize understanding of the benefits of increased  physical activity:  Exercising regularly is important. It will improve your overall fitness, flexibility, and endurance.  Regular exercise also will improve your overall health. It can help you control your weight, reduce stress, and improve your bone density.  Over the next year, patient will increase physical activity  as tolerated with a goal of at least 150 minutes of moderate physical activity per week.   You can tell that you are exercising at a moderate intensity if your heart starts beating faster and you start breathing faster but can still hold a conversation.  Moderate-intensity exercise ideas include:  Walking 1 mile (1.6 km) in about 15 minutes  Biking  Hiking  Golfing  Dancing  Water aerobics  Patient will verbalize understanding of everyday activities that increase physical activity by providing examples like the following: ? Yard work, such as: ? Pushing a Conservation officer, nature ? Raking and bagging leaves ? Washing your car ? Pushing a stroller ? Shoveling snow ? Gardening ? Washing windows or floors  Patient will be able to explain general safety guidelines for exercising:   Before you start a new exercise program, talk with your health care provider.  Do not exercise so much that you hurt yourself, feel dizzy, or get very short of breath.  Wear comfortable clothes and wear shoes with good support.  Drink plenty of water while you exercise to prevent dehydration or heat stroke.  Work out until your breathing and your heartbeat get faster.       Personalized Health Maintenance & Screening Recommendations  Pneumococcal vaccine  Influenza vaccine Td vaccine Screening mammography Bone densitometry screening Colorectal cancer screening  Shingles vaccines  Patient declined all of the vaccines at this time.   Lung Cancer Screening Recommended: no (Low Dose CT Chest recommended if Age 61-80 years, 30 pack-year currently smoking OR have quit w/in past 15  years) Hepatitis C Screening recommended: yes HIV Screening recommended: yes  Advanced Directives: Written information was not prepared per patient's request.  Referrals & Orders No orders of the defined types were placed in this encounter.   Follow-up Plan . Follow-up with Emeterio Reeve, DO as planned . Schedule your tetanus vaccine at your pharmacy.  . Please let us know if you would like [place] to send your referral for colonoscopy/cologuard, mammogram and bone density scan. . Medicare wellness in one year.   I have personally reviewed and noted the following in the patient's chart:   . Medical and social history . Use of alcohol, tobacco or illicit drugs  . Current medications and supplements . Functional ability and status . Nutritional status . Physical activity . Advanced directives . List of other physicians . Hospitalizations, surgeries, and ER visits in previous 12 months . Vitals . Screenings to include cognitive, depression, and falls . Referrals and appointments  In addition, I have reviewed and discussed with Mary [name] certain preventive protocols, quality metrics, and best practice recommendations. A written personalized care plan for preventive services as well as general preventive health recommendations is available and can be mailed to the patient at her request.      Tinnie Gens  05/23/2021

## 2021-05-23 NOTE — Patient Instructions (Addendum)
[facility] Maintenance Summary and Written Plan of Care  Ms. Mary [name] ,  Thank you for allowing me to perform your Medicare Annual Wellness Visit and for your ongoing commitment to your health.   Health Maintenance & Immunization History Health Maintenance  Topic Date Due  . COVID-19 Vaccine (3 - Pfizer risk 4-dose series) 06/08/2021 (Originally 06/30/2020)  . Zoster Vaccines- Shingrix (1 of 2) 08/23/2021 (Originally 03/29/2004)  . TETANUS/TDAP  04/18/2022 (Originally 03/29/1973)  . PNA vac Low Risk Adult (1 of 2 - PCV13) 04/18/2022 (Originally 03/30/2019)  . COLONOSCOPY (Pts 45-32yrs Insurance coverage will need to be confirmed)  05/23/2022 (Originally 04/10/2014)  . Hepatitis C Screening  05/23/2022 (Originally 03/29/1974)  . INFLUENZA VACCINE  07/22/2021  . MAMMOGRAM  06/01/2022  . DEXA SCAN  Completed  . HPV VACCINES  Aged Out   Immunization History  Administered Date(s) Administered  . PFIZER(Purple Top)SARS-COV-2 Vaccination 05/12/2020, 06/02/2020    These are the patient goals that we discussed: Goals Addressed              This Visit's Progress   .  Patient Stated (pt-stated)        05/23/2021 AWV Goal: Exercise for General Health   Patient will verbalize understanding of the benefits of increased physical activity:  Exercising regularly is important. It will improve your overall fitness, flexibility, and endurance.  Regular exercise also will improve your overall health. It can help you control your weight, reduce stress, and improve your bone density.  Over the next year, patient will increase physical activity as tolerated with a goal of at least 150 minutes of moderate physical activity per week.   You can tell that you are exercising at a moderate intensity if your heart starts beating faster and you start breathing faster but can still hold a conversation.  Moderate-intensity exercise ideas include:  Walking 1 mile (1.6 km) in about 15  minutes  Biking  Hiking  Golfing  Dancing  Water aerobics  Patient will verbalize understanding of everyday activities that increase physical activity by providing examples like the following: ? Yard work, such as: ? Pushing a Conservation officer, nature ? Raking and bagging leaves ? Washing your car ? Pushing a stroller ? Shoveling snow ? Gardening ? Washing windows or floors  Patient will be able to explain general safety guidelines for exercising:   Before you start a new exercise program, talk with your health care provider.  Do not exercise so much that you hurt yourself, feel dizzy, or get very short of breath.  Wear comfortable clothes and wear shoes with good support.  Drink plenty of water while you exercise to prevent dehydration or heat stroke.  Work out until your breathing and your heartbeat get faster.         This is a list of Health Maintenance Items that are overdue or due now: Pneumococcal vaccine  Influenza vaccine Td vaccine Screening mammography Bone densitometry screening Colorectal cancer screening  Shingles vaccines  Orders/Referrals Placed Today: No orders of the defined types were placed in this encounter.  (Contact our referral department at [phone] if you have not spoken with someone about your referral appointment within the next 5 days)    Follow-up Plan . Follow-up with Emeterio Reeve, DO as planned . Schedule your tetanus vaccine at your pharmacy.  . Please let us know if you would like [place] to send your referral for colonoscopy/cologuard, mammogram and bone density scan. . Medicare wellness in one  year.       Bone Density Test A bone density test uses a type of X-ray to measure the amount of calcium and other minerals in a person's bones. It can measure bone density in the hip and the spine. The test is similar to having a regular X-ray. This test may also be called:  Bone densitometry.  Bone mineral density  test.  Dual-energy X-ray absorptiometry (DEXA). You may have this test to:  Diagnose a condition that causes weak or thin bones (osteoporosis).  Screen you for osteoporosis.  Predict your risk for a broken bone (fracture).  Determine how well your osteoporosis treatment is working. Tell a health care provider about:  Any allergies you have.  All medicines you are taking, including vitamins, herbs, eye drops, creams, and over-the-counter medicines.  Any problems you or family members have had with anesthetic medicines.  Any blood disorders you have.  Any surgeries you have had.  Any medical conditions you have.  Whether you are pregnant or may be pregnant.  Any medical tests you have had within the past 14 days that used contrast material. What are the risks? Generally, this is a safe test. However, it does expose you to a small amount of radiation, which can slightly increase your cancer risk. What happens before the test?  Do not take any calcium supplements within the 24 hours before your test.  You will need to remove all metal jewelry, eyeglasses, removable dental appliances, and any other metal objects on your body. What happens during the test?  You will lie down on an exam table. There will be an X-ray generator below you and an imaging device above you.  Other devices, such as boxes or braces, may be used to position your body properly for the scan.  The machine will slowly scan your body. You will need to keep very still while the machine does the scan.  The images will show up on a screen in the room. Images will be examined by a specialist after your test is finished. The procedure may vary among health care providers and hospitals.   What can I expect after the test? It is up to you to get the results of your test. Ask your health care provider, or the department that is doing the test, when your results will be ready. Summary  A bone density test is an  imaging test that uses a type of X-ray to measure the amount of calcium and other minerals in your bones.  The test may be used to diagnose or screen you for a condition that causes weak or thin bones (osteoporosis), predict your risk for a broken bone (fracture), or determine how well your osteoporosis treatment is working.  Do not take any calcium supplements within 24 hours before your test.  Ask your health care provider, or the department that is doing the test, when your results will be ready. This information is not intended to replace advice given to you by your health care provider. Make sure you discuss any questions you have with your health care provider. Document Revised: 05/24/2020 Document Reviewed: 05/24/2020 Elsevier Patient Education  2021 [place].   Colonoscopy, Adult A colonoscopy is a procedure to look at the entire large intestine. This procedure is done using a long, thin, flexible tube that has a camera on the end. You may have a colonoscopy:  As a part of normal colorectal screening.  If you have certain symptoms, such as: ?  A low number of red blood cells in your blood (anemia). ? Diarrhea that does not go away. ? Pain in your abdomen. ? Blood in your stool. A colonoscopy can help screen for and diagnose medical problems, including:  Tumors.  Extra tissue that grows where mucus forms (polyps).  Inflammation.  Areas of bleeding. Tell your health care provider about:  Any allergies you have.  All medicines you are taking, including vitamins, herbs, eye drops, creams, and over-the-counter medicines.  Any problems you or family members have had with anesthetic medicines.  Any blood disorders you have.  Any surgeries you have had.  Any medical conditions you have.  Any problems you have had with having bowel movements.  Whether you are pregnant or may be pregnant. What are the risks? Generally, this is a safe procedure. However, problems  may occur, including:  Bleeding.  Damage to your intestine.  Allergic reactions to medicines given during the procedure.  Infection. This is rare. What happens before the procedure? Eating and drinking restrictions Follow instructions from your health care provider about eating or drinking restrictions, which may include:  A few days before the procedure: ? Follow a low-fiber diet. ? Avoid nuts, seeds, dried fruit, raw fruits, and vegetables.  1-3 days before the procedure: ? Eat only gelatin dessert or ice pops. ? Drink only clear liquids, such as water, clear juice, clear broth or bouillon, black coffee or tea, or clear soft drinks or sports drinks. ? Avoid liquids that contain red or purple dye.  The day of the procedure: ? Do not eat solid foods. You may continue to drink clear liquids until up to 2 hours before the procedure. ? Do not eat or drink anything starting 2 hours before the procedure, or within the time period that your health care provider recommends. Bowel prep If you were prescribed a bowel prep to take by mouth (orally) to clean out your colon:  Take it as told by your health care provider. Starting the day before your procedure, you will need to drink a large amount of liquid medicine. The liquid will cause you to have many bowel movements of loose stool until your stool becomes almost clear or light green.  If your skin or the opening between the buttocks (anus) gets irritated from diarrhea, you may relieve the irritation using: ? Wipes with medicine in them, such as adult wet wipes with aloe and vitamin E. ? A product to soothe skin, such as petroleum jelly.  If you vomit while drinking the bowel prep: ? Take a break for up to 60 minutes. ? Begin the bowel prep again. ? Call your health care provider if you keep vomiting or you cannot take the bowel prep without vomiting.  To clean out your colon, you may also be given: ? Laxative medicines. These help  you have a bowel movement. ? Instructions for enema use. An enema is liquid medicine injected into your rectum. Medicines Ask your health care provider about:  Changing or stopping your regular medicines or supplements. This is especially important if you are taking iron supplements, diabetes medicines, or blood thinners.  Taking medicines such as aspirin and ibuprofen. These medicines can thin your blood. Do not take these medicines unless your health care provider tells you to take them.  Taking over-the-counter medicines, vitamins, herbs, and supplements. General instructions  Ask your health care provider what steps will be taken to help prevent infection. These may include washing skin with a germ-killing  soap.  Plan to have someone take you home from the hospital or clinic. What happens during the procedure?  An IV will be inserted into one of your veins.  You may be given one or more of the following: ? A medicine to help you relax (sedative). ? A medicine to numb the area (local anesthetic). ? A medicine to make you fall asleep (general anesthetic). This is rarely needed.  You will lie on your side with your knees bent.  The tube will: ? Have oil or gel put on it (be lubricated). ? Be inserted into your anus. ? Be gently eased through all parts of your large intestine.  Air will be sent into your colon to keep it open. This may cause some pressure or cramping.  Images will be taken with the camera and will appear on a screen.  A small tissue sample may be removed to be looked at under a microscope (biopsy). The tissue may be sent to a lab for testing if any signs of problems are found.  If small polyps are found, they may be removed and checked for cancer cells.  When the procedure is finished, the tube will be removed. The procedure may vary among health care providers and hospitals.   What happens after the procedure?  Your blood pressure, heart rate, breathing  rate, and blood oxygen level will be monitored until you leave the hospital or clinic.  You may have a small amount of blood in your stool.  You may pass gas and have mild cramping or bloating in your abdomen. This is caused by the air that was used to open your colon during the exam.  Do not drive for 24 hours after the procedure.  It is up to you to get the results of your procedure. Ask your health care provider, or the department that is doing the procedure, when your results will be ready. Summary  A colonoscopy is a procedure to look at the entire large intestine.  Follow instructions from your health care provider about eating and drinking before the procedure.  If you were prescribed an oral bowel prep to clean out your colon, take it as told by your health care provider.  During the colonoscopy, a flexible tube with a camera on its end is inserted into the anus and then passed into the other parts of the large intestine. This information is not intended to replace advice given to you by your health care provider. Make sure you discuss any questions you have with your health care provider. Document Revised: 07/01/2019 Document Reviewed: 07/01/2019 Elsevier Patient Education  [place].   Diabetes Mellitus and [place] care is an important part of your health, especially when you have diabetes. Diabetes may cause you to have problems because of poor blood flow (circulation) to your feet and legs, which can cause your skin to:  Become thinner and drier.  Break more easily.  Heal more slowly.  Peel and crack. You may also have nerve damage (neuropathy) in your legs and feet, causing decreased feeling in them. This means that you may not notice minor injuries to your feet that could lead to more serious problems. Noticing and addressing any potential problems early is the best way to prevent future foot problems. How to care for your feet Foot hygiene  Wash  your feet daily with warm water and mild soap. Do not use hot water. Then, pat your feet and the areas between  your toes until they are completely dry. Do not soak your feet as this can dry your skin.  Trim your toenails straight across. Do not dig under them or around the cuticle. File the edges of your nails with an emery board or nail file.  Apply a moisturizing lotion or petroleum jelly to the skin on your feet and to dry, brittle toenails. Use lotion that does not contain alcohol and is unscented. Do not apply lotion between your toes.   Shoes and socks  Wear clean socks or stockings every day. Make sure they are not too tight. Do not wear knee-high stockings since they may decrease blood flow to your legs.  Wear shoes that fit properly and have enough cushioning. Always look in your shoes before you put them on to be sure there are no objects inside.  To break in new shoes, wear them for just a few hours a day. This prevents injuries on your feet. Wounds, scrapes, corns, and calluses  Check your feet daily for blisters, cuts, bruises, sores, and redness. If you cannot see the bottom of your feet, use a mirror or ask someone for help.  Do not cut corns or calluses or try to remove them with medicine.  If you find a minor scrape, cut, or break in the skin on your feet, keep it and the skin around it clean and dry. You may clean these areas with mild soap and water. Do not clean the area with peroxide, alcohol, or iodine.  If you have a wound, scrape, corn, or callus on your foot, look at it several times a day to make sure it is healing and not infected. Check for: ? Redness, swelling, or pain. ? Fluid or blood. ? Warmth. ? Pus or a bad smell.   General tips  Do not cross your legs. This may decrease blood flow to your feet.  Do not use heating pads or hot water bottles on your feet. They may burn your skin. If you have lost feeling in your feet or legs, you may not know this is  happening until it is too late.  Protect your feet from hot and cold by wearing shoes, such as at the beach or on hot pavement.  Schedule a complete foot exam at least once a year (annually) or more often if you have foot problems. Report any cuts, sores, or bruises to your health care provider immediately. Where to find more information  American Diabetes Association: www.diabetes.org  Association of Diabetes Care & Education Specialists: www.diabeteseducator.org Contact a health care provider if:  You have a medical condition that increases your risk of infection and you have any cuts, sores, or bruises on your feet.  You have an injury that is not healing.  You have redness on your legs or feet.  You feel burning or tingling in your legs or feet.  You have pain or cramps in your legs and feet.  Your legs or feet are numb.  Your feet always feel cold.  You have pain around any toenails. Get help right away if:  You have a wound, scrape, corn, or callus on your foot and: ? You have pain, swelling, or redness that gets worse. ? You have fluid or blood coming from the wound, scrape, corn, or callus. ? Your wound, scrape, corn, or callus feels warm to the touch. ? You have pus or a bad smell coming from the wound, scrape, corn, or callus. ? You have a  fever. ? You have a red line going up your leg. Summary  Check your feet every day for blisters, cuts, bruises, sores, and redness.  Apply a moisturizing lotion or petroleum jelly to the skin on your feet and to dry, brittle toenails.  Wear shoes that fit properly and have enough cushioning.  If you have foot problems, report any cuts, sores, or bruises to your health care provider immediately.  Schedule a complete foot exam at least once a year (annually) or more often if you have foot problems. This information is not intended to replace advice given to you by your health care provider. Make sure you discuss any questions  you have with your health care provider. Document Revised: 06/28/2020 Document Reviewed: 06/28/2020 Elsevier Patient Education  [place] Maintenance, Female Adopting a healthy lifestyle and getting preventive care are important in promoting health and wellness. Ask your health care provider about:  The right schedule for you to have regular tests and exams.  Things you can do on your own to prevent diseases and keep yourself healthy. What should I know about diet, weight, and exercise? Eat a healthy diet  Eat a diet that includes plenty of vegetables, fruits, low-fat dairy products, and lean protein.  Do not eat a lot of foods that are high in solid fats, added sugars, or sodium.   Maintain a healthy weight Body mass index (BMI) is used to identify weight problems. It estimates body fat based on height and weight. Your health care provider can help determine your BMI and help you achieve or maintain a healthy weight. Get regular exercise Get regular exercise. This is one of the most important things you can do for your health. Most adults should:  Exercise for at least 150 minutes each week. The exercise should increase your heart rate and make you sweat (moderate-intensity exercise).  Do strengthening exercises at least twice a week. This is in addition to the moderate-intensity exercise.  Spend less time sitting. Even light physical activity can be beneficial. Watch cholesterol and blood lipids Have your blood tested for lipids and cholesterol at 67 years of age, then have this test every 5 years. Have your cholesterol levels checked more often if:  Your lipid or cholesterol levels are high.  You are older than 67 years of age.  You are at high risk for heart disease. What should I know about cancer screening? Depending on your health history and family history, you may need to have cancer screening at various ages. This may include screening for:  Breast  cancer.  Cervical cancer.  Colorectal cancer.  Skin cancer.  Lung cancer. What should I know about heart disease, diabetes, and high blood pressure? Blood pressure and heart disease  High blood pressure causes heart disease and increases the risk of stroke. This is more likely to develop in people who have high blood pressure readings, are of African descent, or are overweight.  Have your blood pressure checked: ? Every 3-5 years if you are 60-41 years of age. ? Every year if you are 32 years old or older. Diabetes Have regular diabetes screenings. This checks your fasting blood sugar level. Have the screening done:  Once every three years after age 52 if you are at a normal weight and have a low risk for diabetes.  More often and at a younger age if you are overweight or have a high risk for diabetes. What should I know about preventing infection?  Hepatitis B If you have a higher risk for hepatitis B, you should be screened for this virus. Talk with your health care provider to find out if you are at risk for hepatitis B infection. Hepatitis C Testing is recommended for:  Everyone born from [birth year] through 1967.  Anyone with known risk factors for hepatitis C. Sexually transmitted infections (STIs)  Get screened for STIs, including gonorrhea and chlamydia, if: ? You are sexually active and are younger than 67 years of age. ? You are older than 67 years of age and your health care provider tells you that you are at risk for this type of infection. ? Your sexual activity has changed since you were last screened, and you are at increased risk for chlamydia or gonorrhea. Ask your health care provider if you are at risk.  Ask your health care provider about whether you are at high risk for HIV. Your health care provider may recommend a prescription medicine to help prevent HIV infection. If you choose to take medicine to prevent HIV, you should first get tested for HIV. You should  then be tested every 3 months for as long as you are taking the medicine. Pregnancy  If you are about to stop having your period (premenopausal) and you may become pregnant, seek counseling before you get pregnant.  Take 400 to 800 micrograms (mcg) of folic acid every day if you become pregnant.  Ask for birth control (contraception) if you want to prevent pregnancy. Osteoporosis and menopause Osteoporosis is a disease in which the bones lose minerals and strength with aging. This can result in bone fractures. If you are 65 years old or older, or if you are at risk for osteoporosis and fractures, ask your health care provider if you should:  Be screened for bone loss.  Take a calcium or vitamin D supplement to lower your risk of fractures.  Be given hormone replacement therapy (HRT) to treat symptoms of menopause. Follow these instructions at home: Lifestyle  Do not use any products that contain nicotine or tobacco, such as cigarettes, e-cigarettes, and chewing tobacco. If you need help quitting, ask your health care provider.  Do not use street drugs.  Do not share needles.  Ask your health care provider for help if you need support or information about quitting drugs. Alcohol use  Do not drink alcohol if: ? Your health care provider tells you not to drink. ? You are pregnant, may be pregnant, or are planning to become pregnant.  If you drink alcohol: ? Limit how much you use to 0-1 drink a day. ? Limit intake if you are breastfeeding.  Be aware of how much alcohol is in your drink. In the [place] one drink equals one 12 oz bottle of beer (355 mL), one 5 oz glass of wine (148 mL), or one 1 oz glass of hard liquor (44 mL). General instructions  Schedule regular health, dental, and eye exams.  Stay current with your vaccines.  Tell your health care provider if: ? You often feel depressed. ? You have ever been abused or do not feel safe at home. Summary  Adopting a  healthy lifestyle and getting preventive care are important in promoting health and wellness.  Follow your health care provider's instructions about healthy diet, exercising, and getting tested or screened for diseases.  Follow your health care provider's instructions on monitoring your cholesterol and blood pressure. This information is not intended to replace advice given to you by your  health care provider. Make sure you discuss any questions you have with your health care provider. Document Revised: 12/01/2018 Document Reviewed: 12/01/2018 Elsevier Patient Education  2021 [place] A mammogram is a low energy X-ray of the breasts that is done to check for abnormal changes. This procedure can screen for and detect any changes that may indicate breast cancer. Mammograms are regularly done on women. A man may have a mammogram if he has a lump or swelling in his breast. A mammogram can also identify other changes and variations in the breast, such as:  Inflammation of the breast tissue (mastitis).  An infected area that contains a collection of pus (abscess).  A fluid-filled sac (cyst).  Fibrocystic changes. This is when breast tissue becomes denser, which can make the tissue feel rope-like or uneven under the skin.  Tumors that are not cancerous (benign). Tell a health care provider:  About any allergies you have.  If you have breast implants.  If you have had previous breast disease, biopsy, or surgery.  If you are breastfeeding.  If you are younger than age 28.  If you have a family history of breast cancer.  Whether you are pregnant or may be pregnant. What are the risks? Generally, this is a safe procedure. However, problems may occur, including:  Exposure to radiation. Radiation levels are very low with this test.  The results being misinterpreted.  The need for further tests.  The inability of the mammogram to detect certain cancers. What happens  before the procedure?  Schedule your test about 1-2 weeks after your menstrual period if you are still menstruating. This is usually when your breasts are the least tender.  If you have had a mammogram done at a different facility in the past, get the mammogram X-rays or have them sent to your current exam facility. The new and old images will be compared.  Wash your breasts and underarms on the day of the test.  Do not wear deodorants, perfumes, lotions, or powders anywhere on your body on the day of the test.  Remove any jewelry from your neck.  Wear clothes that you can change into and out of easily. What happens during the procedure?  You will undress from the waist up and put on a gown that opens in the front.  You will stand in front of the X-ray machine.  Each breast will be placed between two plastic or glass plates. The plates will compress your breast for a few seconds. Try to stay as relaxed as possible during the procedure. This does not cause any harm to your breasts and any discomfort you feel will be very brief.  X-rays will be taken from different angles of each breast. The procedure may vary among health care providers and hospitals.   What happens after the procedure?  The mammogram will be examined by a specialist (radiologist).  You may need to repeat certain parts of the test, depending on the quality of the images. This is commonly done if the radiologist needs a better view of the breast tissue.  You may resume your normal activities.  It is up to you to get the results of your procedure. Ask your health care provider, or the department that is doing the procedure, when your results will be ready. Summary  A mammogram is a low energy X-ray of the breasts that is done to check for abnormal changes. A man may have a mammogram if he has  a lump or swelling in his breast.  If you have had a mammogram done at a different facility in the past, get the mammogram  X-rays or have them sent to your current exam facility in order to compare them.  Schedule your test about 1-2 weeks after your menstrual period if you are still menstruating.  For this test, each breast will be placed between two plastic or glass plates. The plates will compress your breast for a few seconds.  Ask when your test results will be ready. Make sure you get your test results. This information is not intended to replace advice given to you by your health care provider. Make sure you discuss any questions you have with your health care provider. Document Revised: 07/29/2018 Document Reviewed: 07/29/2018 Elsevier Patient Education  [place].

## 2021-05-26 ENCOUNTER — Other Ambulatory Visit: Payer: Self-pay | Admitting: Osteopathic Medicine

## 2021-05-26 DIAGNOSIS — E119 Type 2 diabetes mellitus without complications: Secondary | ICD-10-CM

## 2021-06-27 ENCOUNTER — Encounter: Payer: Self-pay | Admitting: Osteopathic Medicine

## 2021-07-02 ENCOUNTER — Other Ambulatory Visit: Payer: Self-pay | Admitting: General Practice

## 2021-07-02 DIAGNOSIS — Z780 Asymptomatic menopausal state: Secondary | ICD-10-CM

## 2021-07-02 NOTE — Progress Notes (Signed)
Patient had a medicare wellness visit in June and called and said she was ready for a referral for her bone density. She said that she will go back to [facility] for her mammogram.

## 2021-07-03 ENCOUNTER — Other Ambulatory Visit: Payer: Self-pay | Admitting: Internal Medicine

## 2021-07-03 ENCOUNTER — Other Ambulatory Visit: Payer: Self-pay | Admitting: Osteopathic Medicine

## 2021-07-03 DIAGNOSIS — Z1231 Encounter for screening mammogram for malignant neoplasm of breast: Secondary | ICD-10-CM

## 2021-07-08 ENCOUNTER — Other Ambulatory Visit: Payer: Self-pay

## 2021-07-08 ENCOUNTER — Ambulatory Visit
Admission: RE | Admit: 2021-07-08 | Discharge: 2021-07-08 | Disposition: A | Discharge status: Home / Self Care | Payer: Medicare HMO | Source: Ambulatory Visit | Attending: Osteopathic Medicine | Admitting: Osteopathic Medicine

## 2021-07-08 DIAGNOSIS — Z1231 Encounter for screening mammogram for malignant neoplasm of breast: Secondary | ICD-10-CM | POA: Diagnosis not present

## 2021-07-17 ENCOUNTER — Other Ambulatory Visit: Payer: Self-pay

## 2021-07-17 ENCOUNTER — Ambulatory Visit (INDEPENDENT_AMBULATORY_CARE_PROVIDER_SITE_OTHER): Payer: Medicare HMO

## 2021-07-17 DIAGNOSIS — M8589 Other specified disorders of bone density and structure, multiple sites: Secondary | ICD-10-CM | POA: Diagnosis not present

## 2021-07-17 DIAGNOSIS — Z780 Asymptomatic menopausal state: Secondary | ICD-10-CM | POA: Diagnosis not present

## 2021-07-17 DIAGNOSIS — Z0000 Encounter for general adult medical examination without abnormal findings: Secondary | ICD-10-CM

## 2021-07-23 ENCOUNTER — Encounter: Payer: Self-pay | Admitting: Osteopathic Medicine

## 2021-08-19 ENCOUNTER — Ambulatory Visit: Payer: Medicare HMO | Admitting: Osteopathic Medicine

## 2021-08-20 ENCOUNTER — Other Ambulatory Visit: Payer: Self-pay

## 2021-08-20 ENCOUNTER — Ambulatory Visit (INDEPENDENT_AMBULATORY_CARE_PROVIDER_SITE_OTHER): Payer: Medicare HMO | Admitting: Osteopathic Medicine

## 2021-08-20 ENCOUNTER — Ambulatory Visit: Payer: Medicare HMO | Admitting: Osteopathic Medicine

## 2021-08-20 VITALS — BP 114/60 | HR 113 | Wt 213.0 lb

## 2021-08-20 DIAGNOSIS — E119 Type 2 diabetes mellitus without complications: Secondary | ICD-10-CM | POA: Diagnosis not present

## 2021-08-20 DIAGNOSIS — E1169 Type 2 diabetes mellitus with other specified complication: Secondary | ICD-10-CM | POA: Diagnosis not present

## 2021-08-20 DIAGNOSIS — E1159 Type 2 diabetes mellitus with other circulatory complications: Secondary | ICD-10-CM | POA: Diagnosis not present

## 2021-08-20 DIAGNOSIS — I152 Hypertension secondary to endocrine disorders: Secondary | ICD-10-CM | POA: Diagnosis not present

## 2021-08-20 DIAGNOSIS — E559 Vitamin D deficiency, unspecified: Secondary | ICD-10-CM | POA: Diagnosis not present

## 2021-08-20 DIAGNOSIS — E785 Hyperlipidemia, unspecified: Secondary | ICD-10-CM

## 2021-08-20 LAB — POCT GLYCOSYLATED HEMOGLOBIN (HGB A1C): HbA1c, POC (controlled diabetic range): 7.3 % — AB (ref 0.0–7.0)

## 2021-08-20 NOTE — Patient Instructions (Addendum)
Labs ordered for future visit.  An appointment is needed to get blood drawn in our office: please call [phone] to be placed on our phlebotomist's schedule.  Otherwise, no appointment is needed to bet blood drawn at [facility]: located in lower level of [facility], room 135, they are open 7:00 AM - 4:00 PM on Monday thru Friday.   Follow up in 3-4 mos w/ Dr Zigmund Daniel for labs, etc. If you decide it's a good fit and you'd like to continue e/ him, that's great! If not a good fir, we check in about my replacement or can transfer records to another practice of your choice.     FYI to my patients: After six years here, I will be leaving practice at [facility]. My last day here will be 09/20/2021. I will continue to provide your care up until that date, and you will still be considered a patient here after that as long as you want to be!    You will get a letter in the mail explaining details, but after 09/20/2021, my patients have several options to continue care:  1) you can establish care with Dr. Luetta Nutting or Samuel Bouche NP, who are accepting new patients here and are absorbing many folks from my current patient panel, OR...  2) you can see any available provider here on as-needed basis until my official replacement starts (hiring a new doctor has not been finalized yet), OR.Marland KitchenMarland Kitchen 3) if you choose to seek care elsewhere, this office will be happy to facilitate transfer of records, and will refill medications on a case-by-case basis.   It is bittersweet to leave! I will be practicing inpatient hospital medicine at [facility], continuing to serve as chair for [place], and I will also be teaching medical learners. I have truly enjoyed taking care of folks here, but I am also excited for my next adventure doing something a bit different. Take care, and please let us know if you have any questions!   -Dr. Loni Muse.

## 2021-08-20 NOTE — Progress Notes (Signed)
Mary [name] is a 65 y.o. female who presents to  [place] at [facility]  today, 08/20/21, seeking care for the following:  DM2 follow-up  Current Rx Jardiance 25 mg daily  Glimepiride 4 mg bid  Metfromin 1000 mg bid  Pioglitazone 30 mg daily Other: ASA, Statin, ARB Pt reports doing well, pleased w/ A1C progress     08/20/21  7.3 Doing well today, no concerns, would like to stay on current Rx  04/18/21          7.3        not quite at goal of <7 but better --> no change meds  12/11/20          8.0       --> no change Rx eds, pt to work on diet/exercise 09/05/20          10.2     --> added back glimepiride, discussed other Rx   BP Readings from Last 3 Encounters:  08/20/21 114/60  04/18/21 127/69  12/11/20 (!) 118/58   Wt Readings from Last 3 Encounters:  08/20/21 213 lb (96.6 kg)  04/18/21 210 lb (95.3 kg)  12/11/20 209 lb 11.2 oz (95.1 kg)           ASSESSMENT & PLAN with other pertinent findings:  The primary encounter diagnosis was Diabetes mellitus without complication ([place]). Diagnoses of Hyperlipidemia associated with type 2 diabetes mellitus ([place]), Hypertension associated with diabetes ([place]), and Vitamin D deficiency were also pertinent to this visit.   No change to meds today   Pt advised I will be leaving this practice as of end Sept, see below. She is anxious about changing providers, has some history of feeling unheard by physicians in the past. She opts to see Dr Zigmund Daniel (prefers physician) next visit for labs / follow-up and was able to meet him briefly in the hallway today on her way out of the office. I think he will be certainly take great care of her!  Patient Instructions  Labs ordered for future visit.  An appointment is needed to get blood drawn in our office: please call [phone] to be placed on our phlebotomist's schedule.  Otherwise, no appointment is needed to bet blood drawn at [facility]: located  in lower level of [facility], room 135, they are open 7:00 AM - 4:00 PM on Monday thru Friday.   Follow up in 3-4 mos w/ Dr Zigmund Daniel for labs, etc. If you decide it's a good fit and you'd like to continue e/ him, that's great! If not a good fir, we check in about my replacement or can transfer records to another practice of your choice.     FYI to my patients: After six years here, I will be leaving practice at [facility]. My last day here will be 09/20/2021. I will continue to provide your care up until that date, and you will still be considered a patient here after that as long as you want to be!    You will get a letter in the mail explaining details, but after 09/20/2021, my patients have several options to continue care:  1) you can establish care with Dr. Luetta Nutting or Samuel Bouche NP, who are accepting new patients here and are absorbing many folks from my current patient panel, OR...  2) you can see any available provider here on as-needed basis until my official replacement starts (hiring a new doctor has not  been finalized yet), OR.Marland KitchenMarland Kitchen 3) if you choose to seek care elsewhere, this office will be happy to facilitate transfer of records, and will refill medications on a case-by-case basis.   It is bittersweet to leave! 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 I have truly enjoyed taking care of folks here, but I am also excited for my next adventure doing something a bit different. Take care, and please let us know if you have any questions!   -Dr. Loni Muse.     Orders Placed This Encounter  Procedures   CBC   COMPLETE METABOLIC PANEL WITH GFR   Lipid panel   Hemoglobin A1c   VITAMIN D 25 Hydroxy (Vit-D Deficiency, Fractures)   POCT HgB A1C    No orders of the defined types were placed in this encounter.    See below  for relevant physical exam findings  See below for recent lab and imaging results reviewed  Medications, allergies, PMH, PSH, SocH, FamH reviewed below    Follow-up instructions: Return in about 3 months (around 11/20/2021) for ROUTINE LABS (ORDERS ARE IN) AND MONITOR DIABETES W/ DR MATTHEWS .                                        Exam:  BP 114/60   Pulse (!) 113   Wt 213 lb (96.6 kg)   SpO2 97%   BMI 40.25 kg/m  Constitutional: VS see above. General Appearance: alert, well-developed, well-nourished, NAD Neck: No masses, trachea midline.  Respiratory: Normal respiratory effort. no wheeze, no rhonchi, no rales Cardiovascular: S1/S2 normal, no murmur, no rub/gallop auscultated. RRR (no tachycardia, HR approx 90)  Musculoskeletal: Gait normal. Symmetric and independent movement of all extremities Neurological: Normal balance/coordination. No tremor. Skin: warm, dry, intact.  Psychiatric: Normal judgment/insight. Normal mood and affect. Oriented x3.   Current Meds  Medication Sig   aspirin 81 MG tablet Take 81 mg by mouth daily.   atorvastatin (LIPITOR) 10 MG tablet Take 1 tablet (10 mg total) by mouth daily.   Cholecalciferol (VITAMIN D3) 2000 UNITS TABS Take 1 tablet by mouth 2 (two) times daily.    empagliflozin (JARDIANCE) 25 MG TABS tablet Take 1 tablet (25 mg total) by mouth daily before breakfast.   esomeprazole (NEXIUM) 40 MG capsule Take 1 capsule (40 mg total) by mouth daily.   glimepiride (AMARYL) 4 MG tablet Take 1 tablet (4 mg total) by mouth 2 (two) times daily.   glucose blood (ONETOUCH VERIO) test strip Use to monitor blood sugar three times a day. Dx E11.9   metFORMIN (GLUCOPHAGE) 500 MG tablet TAKE TWO TABLETS BY MOUTH TWICE A DAY WITH A MEAL   Microlet Lancets MISC    Multiple Vitamin (MULTIVITAMIN) tablet Take 1 tablet by mouth daily.   pioglitazone (ACTOS) 30 MG tablet Take 1 tablet (30 mg total) by mouth daily.    telmisartan-hydrochlorothiazide (MICARDIS HCT) 40-12.5 MG tablet Take 1 tablet by mouth daily.    Allergies  Allergen Reactions   Codeine Nausea And Vomiting    Patient Active Problem List   Diagnosis Date Noted   Breast cancer ([place]) 06/04/2012    Family History  Problem Relation Age of Onset   Heart attack Mother    Hypertension Mother    Diabetes Mother  Cancer Father        Prostate cancer   Heart disease Father    Hypertension Brother     Social History   Tobacco Use  Smoking Status Former  Smokeless Tobacco Never  Tobacco Comments   Quit smoking in 1990    Past Surgical History:  Procedure Laterality Date   ABDOMINAL HYSTERECTOMY  11/1998   BREAST LUMPECTOMY WITH AXILLARY LYMPH NODE BIOPSY Left    Lumpectomy in 11/2008 and axillary biopsy in 12/2008   [place] OF UTERUS  08/1998    Immunization History  Administered Date(s) Administered   PFIZER(Purple Top)SARS-COV-2 Vaccination 05/12/2020, 06/02/2020    Recent Results (from the past 2160 hour(s))  POCT HgB A1C     Status: Abnormal   Collection Time: 08/20/21  7:49 AM  Result Value Ref Range   Hemoglobin A1C     HbA1c POC (<> result, manual entry)     HbA1c, POC (prediabetic range)     HbA1c, POC (controlled diabetic range) 7.3 (A) 0.0 - 7.0 %    No results found.     All questions at time of visit were answered - patient instructed to contact office with any additional concerns or updates. ER/RTC precautions were reviewed with the patient as applicable.   Please note: manual typing as well as voice recognition software may have been used to produce this document - typos may escape review. Please contact Dr. Sheppard Coil for any needed clarifications.   Total encounter time on date of service, 08/20/21, was 40 minutes spent addressing problems/issues as noted above in [place], including time spent in discussion with patient regarding the HPI, ROS, confirming history, reviewing  Assessment & Plan, as well as time spent on coordination of care, record review.

## 2021-08-26 ENCOUNTER — Other Ambulatory Visit: Payer: Self-pay | Admitting: Osteopathic Medicine

## 2021-08-27 NOTE — Telephone Encounter (Signed)
[place] requesting med refills for lancets. Written by historical provider.

## 2021-10-15 DIAGNOSIS — H35033 Hypertensive retinopathy, bilateral: Secondary | ICD-10-CM | POA: Diagnosis not present

## 2021-10-15 DIAGNOSIS — E119 Type 2 diabetes mellitus without complications: Secondary | ICD-10-CM | POA: Diagnosis not present

## 2021-10-15 DIAGNOSIS — H40013 Open angle with borderline findings, low risk, bilateral: Secondary | ICD-10-CM | POA: Diagnosis not present

## 2021-10-15 DIAGNOSIS — H2513 Age-related nuclear cataract, bilateral: Secondary | ICD-10-CM | POA: Diagnosis not present

## 2021-10-15 LAB — HM DIABETES EYE EXAM

## 2021-10-18 ENCOUNTER — Encounter: Payer: Self-pay | Admitting: Family Medicine

## 2021-11-20 ENCOUNTER — Other Ambulatory Visit: Payer: Self-pay

## 2021-11-20 ENCOUNTER — Other Ambulatory Visit: Payer: Medicare HMO

## 2021-11-20 DIAGNOSIS — E1159 Type 2 diabetes mellitus with other circulatory complications: Secondary | ICD-10-CM | POA: Diagnosis not present

## 2021-11-20 DIAGNOSIS — E119 Type 2 diabetes mellitus without complications: Secondary | ICD-10-CM | POA: Diagnosis not present

## 2021-11-20 DIAGNOSIS — E785 Hyperlipidemia, unspecified: Secondary | ICD-10-CM | POA: Diagnosis not present

## 2021-11-20 DIAGNOSIS — E1169 Type 2 diabetes mellitus with other specified complication: Secondary | ICD-10-CM | POA: Diagnosis not present

## 2021-11-20 DIAGNOSIS — E559 Vitamin D deficiency, unspecified: Secondary | ICD-10-CM | POA: Diagnosis not present

## 2021-11-20 DIAGNOSIS — I152 Hypertension secondary to endocrine disorders: Secondary | ICD-10-CM | POA: Diagnosis not present

## 2021-11-21 LAB — CBC
HCT: 38.8 % (ref 35.0–45.0)
Hemoglobin: 12.4 g/dL (ref 11.7–15.5)
MCH: 26.8 pg — ABNORMAL LOW (ref 27.0–33.0)
MCHC: 32 g/dL (ref 32.0–36.0)
MCV: 84 fL (ref 80.0–100.0)
MPV: 11 fL (ref 7.5–12.5)
Platelets: 292 10*3/uL (ref 140–400)
RBC: 4.62 10*6/uL (ref 3.80–5.10)
RDW: 14.1 % (ref 11.0–15.0)
WBC: 4.8 10*3/uL (ref 3.8–10.8)

## 2021-11-21 LAB — LIPID PANEL
Cholesterol: 147 mg/dL (ref <200)
HDL: 66 mg/dL (ref >=50)
LDL Cholesterol (Calc): 62 mg/dL (calc)
Non-HDL Cholesterol (Calc): 81 mg/dL (calc) (ref <130)
Total CHOL/HDL Ratio: 2.2 (calc) (ref <5.0)
Triglycerides: 109 mg/dL (ref <150)

## 2021-11-21 LAB — COMPLETE METABOLIC PANEL WITH GFR
AG Ratio: 1.6 (calc) (ref 1.0–2.5)
ALT: 20 U/L (ref 6–29)
AST: 20 U/L (ref 10–35)
Albumin: 4.5 g/dL (ref 3.6–5.1)
Alkaline phosphatase (APISO): 65 U/L (ref 37–153)
BUN: 18 mg/dL (ref 7–25)
CO2: 23 mmol/L (ref 20–32)
Calcium: 9.6 mg/dL (ref 8.6–10.4)
Chloride: 100 mmol/L (ref 98–110)
Creat: 0.79 mg/dL (ref 0.50–1.05)
Globulin: 2.8 g/dL (calc) (ref 1.9–3.7)
Glucose, Bld: 125 mg/dL — ABNORMAL HIGH (ref 65–99)
Potassium: 4.5 mmol/L (ref 3.5–5.3)
Sodium: 136 mmol/L (ref 135–146)
Total Bilirubin: 0.4 mg/dL (ref 0.2–1.2)
Total Protein: 7.3 g/dL (ref 6.1–8.1)
eGFR: 82 mL/min/{1.73_m2} (ref >=60)

## 2021-11-21 LAB — HEMOGLOBIN A1C
Hgb A1c MFr Bld: 7.6 % of total Hgb — ABNORMAL HIGH (ref <5.7)
Mean Plasma Glucose: 171 mg/dL
eAG (mmol/L): 9.5 mmol/L

## 2021-11-21 LAB — VITAMIN D 25 HYDROXY (VIT D DEFICIENCY, FRACTURES): Vit D, 25-Hydroxy: 55 ng/mL (ref 30–100)

## 2021-11-22 ENCOUNTER — Other Ambulatory Visit: Payer: Self-pay | Admitting: Osteopathic Medicine

## 2021-11-22 DIAGNOSIS — E119 Type 2 diabetes mellitus without complications: Secondary | ICD-10-CM

## 2021-11-26 ENCOUNTER — Ambulatory Visit: Payer: Medicare HMO | Admitting: Family Medicine

## 2021-11-26 ENCOUNTER — Other Ambulatory Visit: Payer: Self-pay

## 2021-11-26 ENCOUNTER — Encounter: Payer: Self-pay | Admitting: Family Medicine

## 2021-11-26 ENCOUNTER — Ambulatory Visit (INDEPENDENT_AMBULATORY_CARE_PROVIDER_SITE_OTHER): Payer: Medicare HMO | Admitting: Family Medicine

## 2021-11-26 DIAGNOSIS — C50919 Malignant neoplasm of unspecified site of unspecified female breast: Secondary | ICD-10-CM | POA: Diagnosis not present

## 2021-11-26 DIAGNOSIS — E1169 Type 2 diabetes mellitus with other specified complication: Secondary | ICD-10-CM

## 2021-11-26 DIAGNOSIS — M8580 Other specified disorders of bone density and structure, unspecified site: Secondary | ICD-10-CM | POA: Diagnosis not present

## 2021-11-26 DIAGNOSIS — E669 Obesity, unspecified: Secondary | ICD-10-CM | POA: Diagnosis not present

## 2021-11-26 DIAGNOSIS — E785 Hyperlipidemia, unspecified: Secondary | ICD-10-CM | POA: Diagnosis not present

## 2021-11-26 DIAGNOSIS — I10 Essential (primary) hypertension: Secondary | ICD-10-CM | POA: Diagnosis not present

## 2021-11-26 NOTE — Assessment & Plan Note (Signed)
Slight worsening of her diabetes.  We discussed working on dietary changes to help with better control of glucose as well as weight management.

## 2021-11-26 NOTE — Assessment & Plan Note (Signed)
She is tolerating atorvastatin well.  Her lipids are at goal.  Recommend continuation of current strength.

## 2021-11-26 NOTE — Progress Notes (Signed)
Mary [name] - 65 y.o. female MRN [id number]  Date of birth: 1956-02-16  Subjective Chief Complaint  Patient presents with   Diabetes    HPI Mary [name] is a 65 year old female here today for follow-up visit.  She reports that overall she is doing well.  She is a former patient of Dr. Sheppard Coil.  She has a history of type 2 diabetes, hyperlipidemia and hypertension.  She did also have a history of breast cancer and is status post lumpectomy.  Overall her diabetes has been pretty well controlled with combination of Jardiance, glimepiride, metformin and pioglitazone.  She is tolerating these well at current strength.  She does check her blood sugars at home and reports that these are fairly good.  She did have labs drawn prior to her visit today and her A1c has increased slightly to 7.6%.  She does admit she could make some changes to her diet which may improve her blood sugar.  She does take atorvastatin for associated hyperlipidemia as well and her cholesterol is well controlled at this time.  Blood pressure remains well controlled with Micardis HCT.  Renal function remains stable.  ROS:  A comprehensive ROS was completed and negative except as noted per HPI  Allergies  Allergen Reactions   Codeine Nausea And Vomiting    Past Medical History:  Diagnosis Date   Breast cancer ([place]) 10/2008   Left breast invasive ductal carcinoma   Diabetes mellitus without complication (HCC)    GERD (gastroesophageal reflux disease)    Hyperlipidemia    Hypertension    Osteopenia     Past Surgical History:  Procedure Laterality Date   ABDOMINAL HYSTERECTOMY  11/1998   BREAST LUMPECTOMY WITH AXILLARY LYMPH NODE BIOPSY Left    Lumpectomy in 11/2008 and axillary biopsy in 12/2008   DILATION AND CURETTAGE OF UTERUS  08/1998    Social History   Socioeconomic History   Marital status: Divorced    Spouse name: Not on file   Number of children: 0   Years of education: 13   Highest education level: Some  college, no degree  Occupational History    Comment: Retired  Tobacco Use   Smoking status: Former   Smokeless tobacco: Never   Tobacco comments:    Quit smoking in 1990  Substance and Sexual Activity   Alcohol use: No   Drug use: No   Sexual activity: Never    Birth control/protection: Surgical, Post-menopausal  Other Topics Concern   Not on file  Social History Narrative   Lives alone. Enjoys reading, doing yard work, church activities, silver sneakers and doing crossword puzzles.   Social Determinants of Health   Financial Resource Strain: Low Risk    Difficulty of Paying Living Expenses: Not hard at all  Food Insecurity: No Food Insecurity   Worried About Charity fundraiser in the Last Year: Never true   [place] in the Last Year: Never true  Transportation Needs: No Transportation Needs   Lack of Transportation (Medical): No   Lack of Transportation (Non-Medical): No  Physical Activity: Sufficiently Active   Days of Exercise per Week: 3 days   Minutes of Exercise per Session: 50 min  Stress: No Stress Concern Present   Feeling of Stress : Not at all  Social Connections: Moderately Integrated   Frequency of Communication with Friends and Family: More than three times a week   Frequency of Social Gatherings with Friends and Family: Twice a week   Attends  Religious Services: More than 4 times per year   Active Member of Clubs or Organizations: Yes   Attends Archivist Meetings: More than 4 times per year   Marital Status: Divorced    Family History  Problem Relation Age of Onset   Heart attack Mother    Hypertension Mother    Diabetes Mother    Cancer Father        Prostate cancer   Heart disease Father    Hypertension Brother     Health Maintenance  Topic Date Due   COVID-19 Vaccine (3 - Pfizer risk series) 06/30/2020   Zoster Vaccines- Shingrix (1 of 2) 02/24/2022 (Originally 03/29/1975)   INFLUENZA VACCINE  03/21/2022 (Originally 07/22/2021)    TETANUS/TDAP  04/18/2022 (Originally 03/29/1973)   COLONOSCOPY (Pts 45-75yrs Insurance coverage will need to be confirmed)  05/23/2022 (Originally 04/10/2014)   Hepatitis C Screening  05/23/2022 (Originally 03/29/1972)   Pneumonia Vaccine 29+ Years old (1 - PCV) 11/26/2022 (Originally 03/29/1960)   MAMMOGRAM  07/09/2023   DEXA SCAN  Completed   HPV VACCINES  Aged Out     ----------------------------------------------------------------------------------------------------------------------------------------------------------------------------------------------------------------- Physical Exam BP 112/73 (BP Location: Left Arm, Patient Position: Sitting, Cuff Size: Large)   Pulse (!) 119   Ht 5' 1.5" (1.562 m)   Wt 215 lb (97.5 kg)   SpO2 97%   BMI 39.97 kg/m   Physical Exam Constitutional:      Appearance: Normal appearance.  Eyes:     General: No scleral icterus. Cardiovascular:     Rate and Rhythm: Normal rate and regular rhythm.  Pulmonary:     Effort: Pulmonary effort is normal.     Breath sounds: Normal breath sounds.  Musculoskeletal:     Cervical back: Neck supple.  Neurological:     General: No focal deficit present.     Mental Status: She is alert.  Psychiatric:        Mood and Affect: Mood normal.        Behavior: Behavior normal.    ------------------------------------------------------------------------------------------------------------------------------------------------------------------------------------------------------------------- Assessment and Plan  Osteopenia Vitamin D levels are optimal.  I recommend that she continue vitamin D and calcium supplementation.  Weightbearing exercises recommended.  Essential hypertension Blood pressure remains well controlled at this time.  Recommend continuation of Micardis HCT.  Hyperlipidemia associated with type 2 diabetes mellitus ([place]) She is tolerating atorvastatin well.  Her lipids are at goal.  Recommend  continuation of current strength.  Type 2 diabetes mellitus with obesity (HCC) Slight worsening of her diabetes.  We discussed working on dietary changes to help with better control of glucose as well as weight management.  Breast cancer Status postlumpectomy.  Recommend that she continue annual mammograms.   No orders of the defined types were placed in this encounter.   Return in about 4 months (around 03/27/2022) for T2DM.    This visit occurred during the SARS-CoV-2 public health emergency.  Safety protocols were in place, including screening questions prior to the visit, additional usage of staff PPE, and extensive cleaning of exam room while observing appropriate contact time as indicated for disinfecting solutions.

## 2021-11-26 NOTE — Assessment & Plan Note (Signed)
Vitamin D levels are optimal.  I recommend that she continue vitamin D and calcium supplementation.  Weightbearing exercises recommended.

## 2021-11-26 NOTE — Assessment & Plan Note (Signed)
Status postlumpectomy.  Recommend that she continue annual mammograms.

## 2021-11-26 NOTE — Patient Instructions (Signed)
Great to meet you today! Continue current medications and work on dietary changes.  Follow up with me in 3-4 months.

## 2021-11-26 NOTE — Assessment & Plan Note (Signed)
Blood pressure remains well controlled at this time.  Recommend continuation of Micardis HCT.

## 2021-12-21 ENCOUNTER — Other Ambulatory Visit: Payer: Self-pay | Admitting: Osteopathic Medicine

## 2022-02-12 ENCOUNTER — Other Ambulatory Visit: Payer: Self-pay | Admitting: Family Medicine

## 2022-02-12 MED ORDER — MICROLET LANCETS MISC: 99 refills | Status: DC

## 2022-02-14 ENCOUNTER — Other Ambulatory Visit: Payer: Self-pay

## 2022-02-14 DIAGNOSIS — E119 Type 2 diabetes mellitus without complications: Secondary | ICD-10-CM

## 2022-02-14 DIAGNOSIS — E1169 Type 2 diabetes mellitus with other specified complication: Secondary | ICD-10-CM

## 2022-02-14 MED ORDER — ONETOUCH VERIO VI STRP: ORAL_STRIP | 5 refills | Status: DC

## 2022-02-14 MED ORDER — MICROLET LANCETS MISC
5 refills | Status: DC
Start: 2022-02-14 — End: 2023-05-06

## 2022-02-14 MED ORDER — BLOOD GLUCOSE METER KIT
PACK | 0 refills | Status: AC
Start: 2022-02-14 — End: ?

## 2022-02-17 ENCOUNTER — Other Ambulatory Visit: Payer: Self-pay | Admitting: Osteopathic Medicine

## 2022-02-19 ENCOUNTER — Other Ambulatory Visit: Payer: Self-pay | Admitting: Osteopathic Medicine

## 2022-02-21 ENCOUNTER — Other Ambulatory Visit: Payer: Self-pay | Admitting: Osteopathic Medicine

## 2022-02-24 ENCOUNTER — Encounter: Payer: Self-pay | Admitting: Family Medicine

## 2022-02-24 MED ORDER — PIOGLITAZONE HCL 30 MG PO TABS
30.0000 mg | ORAL_TABLET | Freq: Every day | ORAL | 0 refills | Status: DC
Start: 2022-02-24 — End: 2022-02-24

## 2022-02-24 MED ORDER — PIOGLITAZONE HCL 30 MG PO TABS: 30.0000 mg | ORAL_TABLET | Freq: Every day | ORAL | 0 refills | Status: DC

## 2022-02-24 MED ORDER — ATORVASTATIN CALCIUM 10 MG PO TABS: 10.0000 mg | ORAL_TABLET | Freq: Every day | ORAL | 0 refills | Status: DC

## 2022-02-24 MED ORDER — ATORVASTATIN CALCIUM 10 MG PO TABS: 10.0000 mg | ORAL_TABLET | Freq: Every day | ORAL | 2 refills | Status: DC

## 2022-03-21 ENCOUNTER — Encounter: Payer: Self-pay | Admitting: Family Medicine

## 2022-03-21 ENCOUNTER — Ambulatory Visit (INDEPENDENT_AMBULATORY_CARE_PROVIDER_SITE_OTHER): Payer: Medicare HMO | Admitting: Family Medicine

## 2022-03-21 DIAGNOSIS — E669 Obesity, unspecified: Secondary | ICD-10-CM

## 2022-03-21 DIAGNOSIS — E119 Type 2 diabetes mellitus without complications: Secondary | ICD-10-CM

## 2022-03-21 DIAGNOSIS — I10 Essential (primary) hypertension: Secondary | ICD-10-CM | POA: Diagnosis not present

## 2022-03-21 DIAGNOSIS — E1169 Type 2 diabetes mellitus with other specified complication: Secondary | ICD-10-CM

## 2022-03-21 DIAGNOSIS — E785 Hyperlipidemia, unspecified: Secondary | ICD-10-CM | POA: Diagnosis not present

## 2022-03-21 LAB — POCT GLYCOSYLATED HEMOGLOBIN (HGB A1C): HbA1c, POC (controlled diabetic range): 7 % (ref 0.0–7.0)

## 2022-03-21 MED ORDER — METFORMIN HCL 500 MG PO TABS: ORAL_TABLET | ORAL | 3 refills | Status: DC

## 2022-03-21 MED ORDER — ESOMEPRAZOLE MAGNESIUM 40 MG PO CPDR: 40.0000 mg | DELAYED_RELEASE_CAPSULE | Freq: Every day | ORAL | 3 refills | Status: DC

## 2022-03-21 MED ORDER — ATORVASTATIN CALCIUM 10 MG PO TABS: 10.0000 mg | ORAL_TABLET | Freq: Every day | ORAL | 3 refills | Status: DC

## 2022-03-21 MED ORDER — PIOGLITAZONE HCL 30 MG PO TABS: 30.0000 mg | ORAL_TABLET | Freq: Every day | ORAL | 3 refills | Status: DC

## 2022-03-21 NOTE — Assessment & Plan Note (Signed)
Lab Results  ?Component Value Date  ? [place] 60 11/20/2021  ?Lipids are well controlled with atorvastatin, continue at current strength.  ?

## 2022-03-21 NOTE — Progress Notes (Signed)
?Mary [name] - 66 y.o. female MRN [id number]  Date of birth: [birth year] ? ?Subjective ?No chief complaint on file. ? ? ?HPI ?Mary [name] is a 66 y.o. female here today for follow up visit.  She reports that she is doing well at this time.  Blood sugars are managed with combination of jardiance, glimepiride, metformin and pioglitazone.  No issues with tolerance of medications.  Denies symptoms of hypoglycemia.  ? ?BP has remained well controlled with micardis/hct.  Denies chest pain, shortness of breath, palpitations, headache or vision changes.   ? ?ROS:  A comprehensive ROS was completed and negative except as noted per HPI ? ?Allergies  ?Allergen Reactions  ? Codeine Nausea And Vomiting  ? ? ?Past Medical History:  ?Diagnosis Date  ? Breast cancer ([place]) 10/2008  ? Left breast invasive ductal carcinoma  ? Diabetes mellitus without complication ([place])   ? GERD (gastroesophageal reflux disease)   ? Hyperlipidemia   ? Hypertension   ? Osteopenia   ? ? ?Past Surgical History:  ?Procedure Laterality Date  ? ABDOMINAL HYSTERECTOMY  11/1998  ? BREAST LUMPECTOMY WITH AXILLARY LYMPH NODE BIOPSY Left   ? Lumpectomy in 11/2008 and axillary biopsy in 12/2008  ? DILATION AND CURETTAGE OF UTERUS  08/1998  ? ? ?Social History  ? ?Socioeconomic History  ? Marital status: Divorced  ?  Spouse name: Not on file  ? Number of children: 0  ? Years of education: 50  ? Highest education level: Some college, no degree  ?Occupational History  ?  Comment: Retired  ?Tobacco Use  ? Smoking status: Former  ? Smokeless tobacco: Never  ? Tobacco comments:  ?  Quit smoking in 1990  ?Substance and Sexual Activity  ? Alcohol use: No  ? Drug use: No  ? Sexual activity: Never  ?  Birth control/protection: Surgical, Post-menopausal  ?Other Topics Concern  ? Not on file  ?Social History Narrative  ? Lives alone. Enjoys reading, doing yard work, church activities, silver sneakers and doing crossword puzzles.  ? ?Social Determinants of Health  ? ?Financial  Resource Strain: Low Risk   ? Difficulty of Paying Living Expenses: Not hard at all  ?Food Insecurity: No Food Insecurity  ? Worried About Charity fundraiser in the Last Year: Never true  ? Ran Out of Food in the Last Year: Never true  ?Transportation Needs: No Transportation Needs  ? Lack of Transportation (Medical): No  ? Lack of Transportation (Non-Medical): No  ?Physical Activity: Sufficiently Active  ? Days of Exercise per Week: 3 days  ? Minutes of Exercise per Session: 50 min  ?Stress: No Stress Concern Present  ? Feeling of Stress : Not at all  ?Social Connections: Moderately Integrated  ? Frequency of Communication with Friends and Family: More than three times a week  ? Frequency of Social Gatherings with Friends and Family: Twice a week  ? Attends Religious Services: More than 4 times per year  ? Active Member of Clubs or Organizations: Yes  ? Attends Archivist Meetings: More than 4 times per year  ? Marital Status: Divorced  ? ? ?Family History  ?Problem Relation Age of Onset  ? Heart attack Mother   ? Hypertension Mother   ? Diabetes Mother   ? Cancer Father   ?     Prostate cancer  ? Heart disease Father   ? Hypertension Brother   ? ? ?Health Maintenance  ?Topic Date Due  ? FOOT EXAM  Never done  ? Zoster Vaccines- Shingrix (1 of 2) Never done  ? COVID-19 Vaccine (3 - Pfizer risk series) 06/30/2020  ? INFLUENZA VACCINE  03/21/2022 (Originally 07/22/2021)  ? TETANUS/TDAP  04/18/2022 (Originally 03/29/1973)  ? COLONOSCOPY (Pts 45-9yr Insurance coverage will need to be confirmed)  05/23/2022 (Originally 04/10/2014)  ? Hepatitis C Screening  05/23/2022 (Originally 03/29/1972)  ? Pneumonia Vaccine 68 Years old (1 - PCV) 11/26/2022 (Originally 03/30/2019)  ? HEMOGLOBIN A1C  05/20/2022  ? OPHTHALMOLOGY EXAM  10/15/2022  ? MAMMOGRAM  07/09/2023  ? DEXA SCAN  Completed  ? HPV VACCINES  Aged Out   ? ? ? ?----------------------------------------------------------------------------------------------------------------------------------------------------------------------------------------------------------------- ?Physical Exam ?BP 107/71 (BP Location: Left Arm, Patient Position: Sitting, Cuff Size: Large)   Pulse (!) 105   Ht 5' 1.5" (1.562 m)   Wt 199 lb (90.3 kg)   SpO2 99%   BMI 36.99 kg/m?  ? ?Physical Exam ?Constitutional:   ?   Appearance: Normal appearance.  ?Eyes:  ?   General: No scleral icterus. ?Cardiovascular:  ?   Rate and Rhythm: Normal rate and regular rhythm.  ?Pulmonary:  ?   Effort: Pulmonary effort is normal.  ?   Breath sounds: Normal breath sounds.  ?Musculoskeletal:  ?   Cervical back: Neck supple.  ?Neurological:  ?   General: No focal deficit present.  ?   Mental Status: She is alert.  ?Psychiatric:     ?   Mood and Affect: Mood normal.     ?   Behavior: Behavior normal.  ? ? ?------------------------------------------------------------------------------------------------------------------------------------------------------------------------------------------------------------------- ?Assessment and Plan ? ?Hyperlipidemia associated with type 2 diabetes mellitus ([facility] ?Lab Results  ?Component Value Date  ? [id number] 11/20/2021  ?Lipids are well controlled with atorvastatin, continue at current strength.  ? ?Essential hypertension ?Blood pressure remains well controlled.  Continue micardis/hct at current strength.   ? ?Type 2 diabetes mellitus with obesity ([place] ?Blood sugars have improved.  Continue regular exercise and current medications.  ?Return in about 6 months (around 09/20/2022) for Annual/Fasting blood work. ? ? ?No orders of the defined types were placed in this encounter. ? ? ?No follow-ups on file. ? ? ? ?This visit occurred during the SARS-CoV-2 public health emergency.  Safety protocols were in place, including screening questions prior to the visit, additional usage  of staff PPE, and extensive cleaning of exam room while observing appropriate contact time as indicated for disinfecting solutions.  ? ?

## 2022-03-21 NOTE — Assessment & Plan Note (Addendum)
Blood pressure remains well controlled.  Continue micardis/hct at current strength.   ?

## 2022-03-21 NOTE — Assessment & Plan Note (Signed)
Blood sugars have improved.  Continue regular exercise and current medications.  ?Return in about 6 months (around 09/20/2022) for Annual/Fasting blood work. ? ?

## 2022-03-26 ENCOUNTER — Ambulatory Visit: Payer: Medicare HMO | Admitting: Family Medicine

## 2022-03-26 ENCOUNTER — Other Ambulatory Visit: Payer: Self-pay

## 2022-03-26 MED ORDER — EMPAGLIFLOZIN 25 MG PO TABS: ORAL_TABLET | ORAL | 2 refills | Status: DC

## 2022-04-11 ENCOUNTER — Encounter: Payer: Self-pay | Admitting: Family Medicine

## 2022-04-11 NOTE — Telephone Encounter (Signed)
Patient scheduled.

## 2022-04-15 ENCOUNTER — Telehealth: Payer: Medicare HMO | Admitting: Family Medicine

## 2022-04-18 ENCOUNTER — Telehealth: Payer: Self-pay

## 2022-04-18 NOTE — Telephone Encounter (Signed)
Pt lvm concerning upcoming virtual appointment. She has since canceled the appt due to the complication. During the call she wanted to discuss her weight being incorrect. She states that she is currently weighing 200+ lbs and would like that noted in her chart. Also, she was concerned that the A1C may have been incorrect due to the weight being incorrect. Requesting the Office Managers name for contact about unnamed questions.  ? ?Returned the patient's call. No answer. Left a voicemail advising that a message would be sent to Dr. Zigmund Daniel concerning the requested call. Advised that a return call would not be received until Monday. Advised pt that A1C results are machine operated and levels are consistent with her lab trends. Provided Office Managers name and callback information.  ? ?Dr. West Pugh please advise. ? ? ?

## 2022-04-20 NOTE — Telephone Encounter (Signed)
She can have nurse visit if she wants to have weight rechecked/recorded.  Her weight recorded in the chart does not affect her A1c levels, these are independent of each other.  If she has additional concerns she can schedule to discuss.   ? ?Thanks! ? ?CM

## 2022-04-22 ENCOUNTER — Telehealth: Payer: Medicare HMO | Admitting: Family Medicine

## 2022-04-23 NOTE — Telephone Encounter (Signed)
I left a vm for Ms. Mary [name] regarding her concerns. She returned the call and left a vm stating that she was okay. No appt was needed.  ?

## 2022-05-23 ENCOUNTER — Other Ambulatory Visit: Payer: Self-pay | Admitting: Family Medicine

## 2022-06-25 ENCOUNTER — Other Ambulatory Visit: Payer: Self-pay | Admitting: Family Medicine

## 2022-06-25 DIAGNOSIS — Z1231 Encounter for screening mammogram for malignant neoplasm of breast: Secondary | ICD-10-CM

## 2022-06-30 ENCOUNTER — Ambulatory Visit (INDEPENDENT_AMBULATORY_CARE_PROVIDER_SITE_OTHER): Payer: Medicare HMO | Admitting: Family Medicine

## 2022-06-30 VITALS — Ht 62.0 in | Wt 211.0 lb

## 2022-06-30 DIAGNOSIS — Z0000 Encounter for general adult medical examination without abnormal findings: Secondary | ICD-10-CM

## 2022-06-30 NOTE — Progress Notes (Signed)
MEDICARE ANNUAL WELLNESS VISIT  06/30/2022  Telephone Visit Disclaimer This Medicare AWV was conducted by telephone due to national recommendations for restrictions regarding the COVID-19 Pandemic (e.g. social distancing).  I verified, using two identifiers, that I am speaking with Mary [name] or their authorized healthcare agent. I discussed the limitations, risks, security, and privacy concerns of performing an evaluation and management service by telephone and the potential availability of an in-person appointment in the future. The patient expressed understanding and agreed to proceed.  Location of Patient: Home Location of Provider (nurse):  In the office.  Subjective:    Mary [name] is a 66 y.o. female patient of Mary Nutting, DO who had a Medicare Annual Wellness Visit today via telephone. Mary [name] is Retired and lives alone. she does not have any children. she reports that she is socially active and does interact with friends/family regularly. she is moderately physically active and enjoys reading, doing yard work, church activities & silver sneakers  Patient Care Team: Mary Nutting, DO as PCP - General (Family Medicine)     06/30/2022    2:58 PM 05/23/2021    2:12 PM  Advanced Directives  Does Patient Have a Medical Advance Directive? Yes No  Type of Advance Directive Living will   Does patient want to make changes to medical advance directive? No - Patient declined   Would patient like information on creating a medical advance directive?  No - Patient declined    Hospital Utilization Over the Past 12 Months: # of hospitalizations or ER visits: 0 # of surgeries: 0  Review of Systems    Patient reports that her overall health is better compared to last year.  History obtained from chart review and the patient  Patient Reported Readings (BP, Pulse, CBG, Weight, etc) Weight: 211 lbs Height: 29f  Pain Assessment Pain : No/denies pain     Current Medications &  Allergies (verified) Allergies as of 06/30/2022       Reactions   Codeine Nausea And Vomiting        Medication List        Accurate as of June 30, 2022  3:32 PM. If you have any questions, ask your nurse or doctor.          aspirin 81 MG tablet Take 81 mg by mouth daily.   atorvastatin 10 MG tablet Commonly known as: LIPITOR Take 1 tablet (10 mg total) by mouth daily.   blood glucose meter kit and supplies Dispense based on patient and insurance preference. Use up to four times daily as directed. (FOR ICD-10 E10.9, E11.9).   esomeprazole 40 MG capsule Commonly known as: NEXIUM Take 1 capsule (40 mg total) by mouth daily.   glimepiride 4 MG tablet Commonly known as: AMARYL TAKE ONE TABLET BY MOUTH TWICE A DAY   Jardiance 25 MG Tabs tablet Generic drug: empagliflozin TAKE ONE TABLET BY MOUTH DAILY BEFORE BREAKFAST   metFORMIN 500 MG tablet Commonly known as: GLUCOPHAGE TAKE TWO TABLETS BY MOUTH TWICE A DAY WITH A MEAL   Microlet Lancets Misc USE TO CHECK FOR BLOOD SUGAR LEVELS UP TO FOUR TIMES DAILY   multivitamin tablet Take 1 tablet by mouth daily.   OneTouch Verio test strip Generic drug: glucose blood Use to monitor blood sugar three times a day. Dx E11.9   pioglitazone 30 MG tablet Commonly known as: ACTOS Take 1 tablet (30 mg total) by mouth daily.   telmisartan-hydrochlorothiazide 40-12.5 MG tablet Commonly known as: MICARDIS  HCT TAKE ONE TABLET BY MOUTH DAILY   Vitamin D3 50 MCG (2000 UT) Tabs Take 1 tablet by mouth 2 (two) times daily.        History (reviewed): Past Medical History:  Diagnosis Date   Breast cancer ([place]) 10/2008   Left breast invasive ductal carcinoma   Diabetes mellitus without complication ([facility])    GERD (gastroesophageal reflux disease)    Hyperlipidemia    Hypertension    Osteopenia    Past Surgical History:  Procedure Laterality Date   ABDOMINAL HYSTERECTOMY  11/1998   BREAST LUMPECTOMY WITH AXILLARY LYMPH  NODE BIOPSY Left    Lumpectomy in 11/2008 and axillary biopsy in 12/2008   [place] OF UTERUS  08/1998   Family History  Problem Relation Age of Onset   Heart attack Mother    Hypertension Mother    Diabetes Mother    Cancer Father        Prostate cancer   Heart disease Father    Hypertension Brother    Social History   Socioeconomic History   Marital status: Divorced    Spouse name: Not on file   Number of children: 0   Years of education: 13   Highest education level: Some college, no degree  Occupational History    Comment: Retired  Tobacco Use   Smoking status: Former   Smokeless tobacco: Never   Tobacco comments:    Quit smoking in 1990  Substance and Sexual Activity   Alcohol use: No   Drug use: No   Sexual activity: Never    Birth control/protection: Surgical, Post-menopausal  Other Topics Concern   Not on file  Social History Narrative   Lives alone. Enjoys reading, doing yard work, church activities & silver sneakers.   Social Determinants of Health   Financial Resource Strain: Low Risk  (06/30/2022)   Overall Financial Resource Strain (CARDIA)    Difficulty of Paying Living Expenses: Not hard at all  Food Insecurity: No Food Insecurity (06/30/2022)   Hunger Vital Sign    Worried About Running Out of Food in the Last Year: Never true    Ran Out of Food in the Last Year: Never true  Transportation Needs: No Transportation Needs (06/30/2022)   PRAPARE - Hydrologist (Medical): No    Lack of Transportation (Non-Medical): No  Physical Activity: Sufficiently Active (06/30/2022)   Exercise Vital Sign    Days of Exercise per Week: 3 days    Minutes of Exercise per Session: 50 min  Stress: No Stress Concern Present (06/30/2022)   [place]    Feeling of Stress : Not at all  Social Connections: Moderately Integrated (06/30/2022)   Social Connection and  Isolation Panel [NHANES]    Frequency of Communication with Friends and Family: More than three times a week    Frequency of Social Gatherings with Friends and Family: More than three times a week    Attends Religious Services: More than 4 times per year    Active Member of [employer] or Organizations: Yes    Attends Archivist Meetings: More than 4 times per year    Marital Status: Divorced    Activities of Daily Living    06/30/2022    3:06 PM  In your present state of health, do you have any difficulty performing the following activities:  Hearing? 0  Vision? 0  Difficulty concentrating or making decisions? 0  Walking or climbing stairs? 0  Dressing or bathing? 0  Doing errands, shopping? 0  Preparing Food and eating ? N  Using the Toilet? N  In the past six months, have you accidently leaked urine? N  Do you have problems with loss of bowel control? N  Managing your Medications? N  Managing your Finances? N  Housekeeping or managing your Housekeeping? N    Patient Education/ Literacy How often do you need to have someone help you when you read instructions, pamphlets, or other written materials from your doctor or pharmacy?: 1 - Never What is the last grade level you completed in school?: High school + some college  Exercise Current Exercise Habits: Structured exercise class, Type of exercise: Other - see comments (silver sneakers), Time (Minutes): 50, Frequency (Times/Week): 3, Weekly Exercise (Minutes/Week): 150, Intensity: Moderate, Exercise limited by: None identified  Diet Patient reports consuming  2-3  meals a day and 2-3 snack(s) a day Patient reports that her primary diet is: Regular Patient reports that she does have regular access to food.   Depression Screen    06/30/2022    3:01 PM 03/21/2022    8:21 AM 11/26/2021    9:00 AM 05/23/2021    2:17 PM 09/11/2020    9:06 AM 06/21/2020    2:35 PM  PHQ 2/9 Scores  PHQ - 2 Score 0 0 0 0 0 0  PHQ- 9 Score      0      Fall Risk    06/30/2022    3:00 PM 03/21/2022    8:21 AM 11/26/2021    8:59 AM  Fall Risk   Falls in the past year? 0 0 0  Number falls in past yr: 0  0  Injury with Fall? 0 0 0  Risk for fall due to : No Fall Risks No Fall Risks No Fall Risks  Follow up Falls evaluation completed Falls evaluation completed Falls evaluation completed     Objective:  Mary [name] seemed alert and oriented and she participated appropriately during our telephone visit.  Blood Pressure Weight BMI  BP Readings from Last 3 Encounters:  03/21/22 107/71  11/26/21 112/73  08/20/21 114/60   Wt Readings from Last 3 Encounters:  06/30/22 211 lb (95.7 kg)  03/21/22 199 lb (90.3 kg)  11/26/21 215 lb (97.5 kg)   BMI Readings from Last 1 Encounters:  06/30/22 38.59 kg/m    *Unable to obtain current vital signs, weight, and BMI due to telephone visit type  Hearing/Vision  Mary [name] did not seem to have difficulty with hearing/understanding during the telephone conversation Reports that she has had a formal eye exam by an eye care professional within the past year Reports that she has not had a formal hearing evaluation within the past year *Unable to fully assess hearing and vision during telephone visit type  Cognitive Function:    06/30/2022    3:12 PM 05/23/2021    2:30 PM  6CIT Screen  What Year? 0 points 0 points  What month? 0 points 0 points  What time? 0 points 0 points  Count back from 20 0 points 0 points  Months in reverse 0 points 0 points  Repeat phrase 0 points 0 points  Total Score 0 points 0 points   (Normal:0-7, Significant for Dysfunction: >8)  Normal Cognitive Function Screening: Yes   Immunization & Health Maintenance Record Immunization History  Administered Date(s) Administered   PFIZER(Purple Top)SARS-COV-2 Vaccination 05/12/2020, 06/02/2020  Health Maintenance  Topic Date Due   Diabetic kidney evaluation - Urine ACR  07/01/2022 (Originally 03/29/1972)   COVID-19  Vaccine (3 - Pfizer risk series) 08/22/2022 (Originally 06/30/2020)   Zoster Vaccines- Shingrix (1 of 2) 08/22/2022 (Originally 03/29/1973)   Pneumonia Vaccine 63+ Years old (1 - PCV) 11/26/2022 (Originally 03/30/2019)   COLONOSCOPY (Pts 45-81yr Insurance coverage will need to be confirmed)  07/01/2023 (Originally 04/10/2014)   TETANUS/TDAP  07/01/2023 (Originally 03/29/1973)   Hepatitis C Screening  07/01/2023 (Originally 03/29/1972)   INFLUENZA VACCINE  07/22/2022   HEMOGLOBIN A1C  09/20/2022   OPHTHALMOLOGY EXAM  10/15/2022   Diabetic kidney evaluation - GFR measurement  11/20/2022   FOOT EXAM  03/22/2023   MAMMOGRAM  07/09/2023   DEXA SCAN  07/17/2024   HPV VACCINES  Aged Out       Assessment  This is a routine wellness examination for Mary [name]  Health Maintenance: Due or Overdue There are no preventive care reminders to display for this patient.   Mary [name] not need a referral for Community Assistance: Care Management:   no Social Work:    no Prescription Assistance:  no Nutrition/Diabetes Education:  no   Plan:  Personalized Goals  Goals Addressed               This Visit's Progress     Patient Stated (pt-stated)        Would like to loose weight.       Personalized Health Maintenance & Screening Recommendations  Pneumococcal vaccine  Td vaccine Colorectal cancer screening Diabetic Urine ACR Shingrix vaccine  Patient declined Shingrix vaccine and Pneumococcal vaccine at this time. She stated that she had the tetanus shot and she will get the records for the office. Colorectal cancer screening- patient would like to discuss her options with PCP.  Lung Cancer Screening Recommended: no (Low Dose CT Chest recommended if Age 68-80years, 30 pack-year currently smoking OR have quit w/in past 15 years) Hepatitis C Screening recommended: yes HIV Screening recommended: no  Advanced Directives: Written information was not prepared per patient's  request.  Referrals & Orders No orders of the defined types were placed in this encounter.   Follow-up Plan Follow-up with MLuetta Nutting DO as planned She stated that she had the tetanus shot and she will get the records for the office.  Colorectal cancer screening- patient would like to discuss her options with PCP. Urine ACR can be done at your next office visit. Medicare wellness visit in one year.    I have personally reviewed and noted the following in the patient's chart:   Medical and social history Use of alcohol, tobacco or illicit drugs  Current medications and supplements Functional ability and status Nutritional status Physical activity Advanced directives List of other physicians Hospitalizations, surgeries, and ER visits in previous 12 months Vitals Screenings to include cognitive, depression, and falls Referrals and appointments  In addition, I have reviewed and discussed with Mary Nacertain preventive protocols, quality metrics, and best practice recommendations. A written personalized care plan for preventive services as well as general preventive health recommendations is available and can be mailed to the patient at her request.      BTinnie Gens RN BSN  06/30/2022

## 2022-06-30 NOTE — Patient Instructions (Addendum)
[place] Maintenance Summary and Written Plan of Care  Mary [name] ,  Thank you for allowing me to perform your Medicare Annual Wellness Visit and for your ongoing commitment to your health.   Health Maintenance & Immunization History Health Maintenance  Topic Date Due   Diabetic kidney evaluation - Urine ACR  07/01/2022 (Originally 03/29/1974)   COVID-19 Vaccine (3 - Pfizer risk series) 08/22/2022 (Originally 06/30/2020)   Zoster Vaccines- Shingrix (1 of 2) 08/22/2022 (Originally 03/29/1973)   Pneumonia Vaccine 63+ Years old (1 - PCV) 11/26/2022 (Originally 03/30/2019)   COLONOSCOPY (Pts 45-1yr Insurance coverage will need to be confirmed)  07/01/2023 (Originally 04/10/2014)   TETANUS/TDAP  07/01/2023 (Originally 03/29/1973)   Hepatitis C Screening  07/01/2023 (Originally 03/29/1972)   INFLUENZA VACCINE  07/22/2022   HEMOGLOBIN A1C  09/20/2022   OPHTHALMOLOGY EXAM  10/15/2022   Diabetic kidney evaluation - GFR measurement  11/20/2022   FOOT EXAM  03/22/2023   MAMMOGRAM  07/09/2023   DEXA SCAN  07/17/2024   HPV VACCINES  Aged Out   Immunization History  Administered Date(s) Administered   PFIZER(Purple Top)SARS-COV-2 Vaccination 05/12/2020, 06/02/2020    These are the patient goals that we discussed:  Goals Addressed               This Visit's Progress     Patient Stated (pt-stated)        Would like to loose weight.         This is a list of Health Maintenance Items that are overdue or due now: Pneumococcal vaccine  Td vaccine Colorectal cancer screening Diabetic Urine ACR Shingrix vaccine Scheduled for mammogram  Patient declined Shingrix vaccine and Pneumococcal vaccine at this time.  Orders/Referrals Placed Today: No orders of the defined types were placed in this encounter.  (Contact our referral department at [phone] you have not spoken with someone about your referral appointment within the next 5 days)    Follow-up  Plan Follow-up with MLuetta Nutting DO as planned She stated that she had the tetanus shot and she will get the records for the office.  Colorectal cancer screening- patient would like to discuss her options with PCP. Urine ACR can be done at your next office visit. Medicare wellness visit in one year.      Health Maintenance, Female Adopting a healthy lifestyle and getting preventive care are important in promoting health and wellness. Ask your health care provider about: The right schedule for you to have regular tests and exams. Things you can do on your own to prevent diseases and keep yourself healthy. What should I know about diet, weight, and exercise? Eat a healthy diet  Eat a diet that includes plenty of vegetables, fruits, low-fat dairy products, and lean protein. Do not eat a lot of foods that are high in solid fats, added sugars, or sodium. Maintain a healthy weight Body mass index (BMI) is used to identify weight problems. It estimates body fat based on height and weight. Your health care provider can help determine your BMI and help you achieve or maintain a healthy weight. Get regular exercise Get regular exercise. This is one of the most important things you can do for your health. Most adults should: Exercise for at least 150 minutes each week. The exercise should increase your heart rate and make you sweat (moderate-intensity exercise). Do strengthening exercises at least twice a week. This is in addition to the moderate-intensity exercise. Spend less time sitting. Even light  physical activity can be beneficial. Watch cholesterol and blood lipids Have your blood tested for lipids and cholesterol at 68 years of age, then have this test every 5 years. Have your cholesterol levels checked more often if: Your lipid or cholesterol levels are high. You are older than 68 years of age. You are at high risk for heart disease. What should I know about cancer  screening? Depending on your health history and family history, you may need to have cancer screening at various ages. This may include screening for: Breast cancer. Cervical cancer. Colorectal cancer. Skin cancer. Lung cancer. What should I know about heart disease, diabetes, and high blood pressure? Blood pressure and heart disease High blood pressure causes heart disease and increases the risk of stroke. This is more likely to develop in people who have high blood pressure readings or are overweight. Have your blood pressure checked: Every 3-5 years if you are 71-66 years of age. Every year if you are 67 years old or older. Diabetes Have regular diabetes screenings. This checks your fasting blood sugar level. Have the screening done: Once every three years after age 82 if you are at a normal weight and have a low risk for diabetes. More often and at a younger age if you are overweight or have a high risk for diabetes. What should I know about preventing infection? Hepatitis B If you have a higher risk for hepatitis B, you should be screened for this virus. Talk with your health care provider to find out if you are at risk for hepatitis B infection. Hepatitis C Testing is recommended for: Everyone born from [birth year] through 1967. Anyone with known risk factors for hepatitis C. Sexually transmitted infections (STIs) Get screened for STIs, including gonorrhea and chlamydia, if: You are sexually active and are younger than 68 years of age. You are older than 68 years of age and your health care provider tells you that you are at risk for this type of infection. Your sexual activity has changed since you were last screened, and you are at increased risk for chlamydia or gonorrhea. Ask your health care provider if you are at risk. Ask your health care provider about whether you are at high risk for HIV. Your health care provider may recommend a prescription medicine to help prevent HIV  infection. If you choose to take medicine to prevent HIV, you should first get tested for HIV. You should then be tested every 3 months for as long as you are taking the medicine. Pregnancy If you are about to stop having your period (premenopausal) and you may become pregnant, seek counseling before you get pregnant. Take 400 to 800 micrograms (mcg) of folic acid every day if you become pregnant. Ask for birth control (contraception) if you want to prevent pregnancy. Osteoporosis and menopause Osteoporosis is a disease in which the bones lose minerals and strength with aging. This can result in bone fractures. If you are 49 years old or older, or if you are at risk for osteoporosis and fractures, ask your health care provider if you should: Be screened for bone loss. Take a calcium or vitamin D supplement to lower your risk of fractures. Be given hormone replacement therapy (HRT) to treat symptoms of menopause. Follow these instructions at home: Alcohol use Do not drink alcohol if: Your health care provider tells you not to drink. You are pregnant, may be pregnant, or are planning to become pregnant. If you drink alcohol: Limit how much you  have to: 0-1 drink a day. Know how much alcohol is in your drink. In the [place] one drink equals one 12 oz bottle of beer (355 mL), one 5 oz glass of wine (148 mL), or one 1 oz glass of hard liquor (44 mL). Lifestyle Do not use any products that contain nicotine or tobacco. These products include cigarettes, chewing tobacco, and vaping devices, such as e-cigarettes. If you need help quitting, ask your health care provider. Do not use street drugs. Do not share needles. Ask your health care provider for help if you need support or information about quitting drugs. General instructions Schedule regular health, dental, and eye exams. Stay current with your vaccines. Tell your health care provider if: You often feel depressed. You have ever been abused  or do not feel safe at home. Summary Adopting a healthy lifestyle and getting preventive care are important in promoting health and wellness. Follow your health care provider's instructions about healthy diet, exercising, and getting tested or screened for diseases. Follow your health care provider's instructions on monitoring your cholesterol and blood pressure. This information is not intended to replace advice given to you by your health care provider. Make sure you discuss any questions you have with your health care provider. Document Revised: 04/29/2021 Document Reviewed: 04/29/2021 Elsevier Patient Education  [place].

## 2022-07-10 ENCOUNTER — Ambulatory Visit
Admission: RE | Admit: 2022-07-10 | Discharge: 2022-07-10 | Disposition: A | Discharge status: Home / Self Care | Payer: Medicare HMO | Source: Ambulatory Visit | Attending: Family Medicine | Admitting: Family Medicine

## 2022-07-10 DIAGNOSIS — Z1231 Encounter for screening mammogram for malignant neoplasm of breast: Secondary | ICD-10-CM | POA: Diagnosis not present

## 2022-07-10 DIAGNOSIS — E119 Type 2 diabetes mellitus without complications: Secondary | ICD-10-CM | POA: Diagnosis not present

## 2022-07-10 DIAGNOSIS — H2513 Age-related nuclear cataract, bilateral: Secondary | ICD-10-CM | POA: Diagnosis not present

## 2022-07-10 DIAGNOSIS — H40013 Open angle with borderline findings, low risk, bilateral: Secondary | ICD-10-CM | POA: Diagnosis not present

## 2022-07-10 DIAGNOSIS — H35033 Hypertensive retinopathy, bilateral: Secondary | ICD-10-CM | POA: Diagnosis not present

## 2022-07-10 LAB — HM DIABETES EYE EXAM

## 2022-07-18 ENCOUNTER — Encounter: Payer: Self-pay | Admitting: Family Medicine

## 2022-09-10 ENCOUNTER — Telehealth: Payer: Self-pay | Admitting: Family Medicine

## 2022-09-10 DIAGNOSIS — M8580 Other specified disorders of bone density and structure, unspecified site: Secondary | ICD-10-CM

## 2022-09-10 DIAGNOSIS — Z0000 Encounter for general adult medical examination without abnormal findings: Secondary | ICD-10-CM

## 2022-09-10 DIAGNOSIS — I10 Essential (primary) hypertension: Secondary | ICD-10-CM

## 2022-09-10 DIAGNOSIS — E1169 Type 2 diabetes mellitus with other specified complication: Secondary | ICD-10-CM

## 2022-09-10 DIAGNOSIS — E669 Obesity, unspecified: Secondary | ICD-10-CM

## 2022-09-10 NOTE — Telephone Encounter (Signed)
Patient is requesting labs been ordered prior to her appointment with you on 09-23-22 she stated that she was told to call and schedule this.

## 2022-09-11 NOTE — Telephone Encounter (Signed)
Orders entered

## 2022-09-12 ENCOUNTER — Telehealth: Payer: Self-pay

## 2022-09-12 NOTE — Telephone Encounter (Signed)
Please contact the patient to schedule lab appointment. Orders are in the system. Thanks

## 2022-09-12 NOTE — Telephone Encounter (Signed)
Left a detailed vm msg for the patient regarding the provider's note. Patient informed of lab schedule/hours/locations and to fast 6-8 hours prior to having their labs completed. Direct call back info provided.

## 2022-09-17 DIAGNOSIS — Z0000 Encounter for general adult medical examination without abnormal findings: Secondary | ICD-10-CM | POA: Diagnosis not present

## 2022-09-17 DIAGNOSIS — I10 Essential (primary) hypertension: Secondary | ICD-10-CM | POA: Diagnosis not present

## 2022-09-17 DIAGNOSIS — E1169 Type 2 diabetes mellitus with other specified complication: Secondary | ICD-10-CM | POA: Diagnosis not present

## 2022-09-17 DIAGNOSIS — E669 Obesity, unspecified: Secondary | ICD-10-CM | POA: Diagnosis not present

## 2022-09-17 DIAGNOSIS — E785 Hyperlipidemia, unspecified: Secondary | ICD-10-CM | POA: Diagnosis not present

## 2022-09-17 DIAGNOSIS — M8580 Other specified disorders of bone density and structure, unspecified site: Secondary | ICD-10-CM | POA: Diagnosis not present

## 2022-09-17 DIAGNOSIS — E559 Vitamin D deficiency, unspecified: Secondary | ICD-10-CM | POA: Diagnosis not present

## 2022-09-18 LAB — CBC WITH DIFFERENTIAL/PLATELET
Absolute Monocytes: 462 cells/uL (ref 200–950)
Basophils Absolute: 48 cells/uL (ref 0–200)
Basophils Relative: 1.1 %
Eosinophils Absolute: 110 cells/uL (ref 15–500)
Eosinophils Relative: 2.5 %
HCT: 38.4 % (ref 35.0–45.0)
Hemoglobin: 12 g/dL (ref 11.7–15.5)
Lymphs Abs: 1610 cells/uL (ref 850–3900)
MCH: 25.9 pg — ABNORMAL LOW (ref 27.0–33.0)
MCHC: 31.3 g/dL — ABNORMAL LOW (ref 32.0–36.0)
MCV: 82.9 fL (ref 80.0–100.0)
MPV: 11.1 fL (ref 7.5–12.5)
Monocytes Relative: 10.5 %
Neutro Abs: 2169 cells/uL (ref 1500–7800)
Neutrophils Relative %: 49.3 %
Platelets: 282 10*3/uL (ref 140–400)
RBC: 4.63 10*6/uL (ref 3.80–5.10)
RDW: 14.5 % (ref 11.0–15.0)
Total Lymphocyte: 36.6 %
WBC: 4.4 10*3/uL (ref 3.8–10.8)

## 2022-09-18 LAB — HEMOGLOBIN A1C
Hgb A1c MFr Bld: 7.6 % of total Hgb — ABNORMAL HIGH (ref <5.7)
Mean Plasma Glucose: 171 mg/dL
eAG (mmol/L): 9.5 mmol/L

## 2022-09-18 LAB — COMPLETE METABOLIC PANEL WITH GFR
AG Ratio: 1.4 (calc) (ref 1.0–2.5)
ALT: 18 U/L (ref 6–29)
AST: 17 U/L (ref 10–35)
Albumin: 4.2 g/dL (ref 3.6–5.1)
Alkaline phosphatase (APISO): 63 U/L (ref 37–153)
BUN: 24 mg/dL (ref 7–25)
CO2: 26 mmol/L (ref 20–32)
Calcium: 9.2 mg/dL (ref 8.6–10.4)
Chloride: 100 mmol/L (ref 98–110)
Creat: 0.84 mg/dL (ref 0.50–1.05)
Globulin: 3 g/dL (calc) (ref 1.9–3.7)
Glucose, Bld: 131 mg/dL — ABNORMAL HIGH (ref 65–99)
Potassium: 4.9 mmol/L (ref 3.5–5.3)
Sodium: 137 mmol/L (ref 135–146)
Total Bilirubin: 0.5 mg/dL (ref 0.2–1.2)
Total Protein: 7.2 g/dL (ref 6.1–8.1)
eGFR: 76 mL/min/{1.73_m2} (ref >=60)

## 2022-09-18 LAB — MICROALBUMIN / CREATININE URINE RATIO
Creatinine, Urine: 18 mg/dL — ABNORMAL LOW (ref 20–275)
Microalb, Ur: <0.2 mg/dL

## 2022-09-18 LAB — LIPID PANEL W/REFLEX DIRECT LDL
Cholesterol: 129 mg/dL (ref <200)
HDL: 55 mg/dL (ref >=50)
LDL Cholesterol (Calc): 51 mg/dL (calc)
Non-HDL Cholesterol (Calc): 74 mg/dL (calc) (ref <130)
Total CHOL/HDL Ratio: 2.3 (calc) (ref <5.0)
Triglycerides: 146 mg/dL (ref <150)

## 2022-09-18 LAB — VITAMIN D 25 HYDROXY (VIT D DEFICIENCY, FRACTURES): Vit D, 25-Hydroxy: 59 ng/mL (ref 30–100)

## 2022-09-23 ENCOUNTER — Encounter: Payer: Self-pay | Admitting: Family Medicine

## 2022-09-23 ENCOUNTER — Ambulatory Visit (INDEPENDENT_AMBULATORY_CARE_PROVIDER_SITE_OTHER): Payer: Medicare HMO | Admitting: Family Medicine

## 2022-09-23 VITALS — BP 108/68 | HR 86 | Ht 62.0 in | Wt 220.0 lb

## 2022-09-23 DIAGNOSIS — E785 Hyperlipidemia, unspecified: Secondary | ICD-10-CM

## 2022-09-23 DIAGNOSIS — E669 Obesity, unspecified: Secondary | ICD-10-CM

## 2022-09-23 DIAGNOSIS — Z0000 Encounter for general adult medical examination without abnormal findings: Secondary | ICD-10-CM | POA: Diagnosis not present

## 2022-09-23 DIAGNOSIS — I10 Essential (primary) hypertension: Secondary | ICD-10-CM | POA: Diagnosis not present

## 2022-09-23 DIAGNOSIS — E1169 Type 2 diabetes mellitus with other specified complication: Secondary | ICD-10-CM | POA: Diagnosis not present

## 2022-09-23 NOTE — Progress Notes (Signed)
Mary [name] - 66 y.o. female MRN [id number]  Date of birth: 08/16/1956  Subjective Chief Complaint  Patient presents with   Annual Exam    HPI Mary [name] is a 66 year old female here today for annual exam.  She reports that overall she is doing pretty well.  She had labs completed prior to visits which we reviewed in detail today.  Her A1c has increased some since last visit.  She has been trying to work on her diet recently.  She admits that she can be a little more active.  She is a non-smoker.  Denies alcohol use.  Declines flu vaccine today.  Review of Systems  Constitutional:  Negative for chills, fever, malaise/fatigue and weight loss.  HENT:  Negative for congestion, ear pain and sore throat.   Eyes:  Negative for blurred vision, double vision and pain.  Respiratory:  Negative for cough and shortness of breath.   Cardiovascular:  Negative for chest pain and palpitations.  Gastrointestinal:  Negative for abdominal pain, blood in stool, constipation, heartburn and nausea.  Genitourinary:  Negative for dysuria and urgency.  Musculoskeletal:  Negative for joint pain and myalgias.  Neurological:  Negative for dizziness and headaches.  Endo/Heme/Allergies:  Does not bruise/bleed easily.  Psychiatric/Behavioral:  Negative for depression. The patient is not nervous/anxious and does not have insomnia.      Allergies  Allergen Reactions   Codeine Nausea And Vomiting    Past Medical History:  Diagnosis Date   Breast cancer ([place]) 10/2008   Left breast invasive ductal carcinoma   Diabetes mellitus without complication (HCC)    GERD (gastroesophageal reflux disease)    Hyperlipidemia    Hypertension    Osteopenia     Past Surgical History:  Procedure Laterality Date   ABDOMINAL HYSTERECTOMY  11/1998   BREAST LUMPECTOMY WITH AXILLARY LYMPH NODE BIOPSY Left    Lumpectomy in 11/2008 and axillary biopsy in 12/2008   DILATION AND CURETTAGE OF UTERUS  08/1998    Social History    Socioeconomic History   Marital status: Divorced    Spouse name: Not on file   Number of children: 0   Years of education: 13   Highest education level: Some college, no degree  Occupational History    Comment: Retired  Tobacco Use   Smoking status: Former   Smokeless tobacco: Never   Tobacco comments:    Quit smoking in 1990  Substance and Sexual Activity   Alcohol use: No   Drug use: No   Sexual activity: Never    Birth control/protection: Surgical, Post-menopausal  Other Topics Concern   Not on file  Social History Narrative   Lives alone. Enjoys reading, doing yard work, church activities & silver sneakers.   Social Determinants of Health   Financial Resource Strain: Low Risk  (06/30/2022)   Overall Financial Resource Strain (CARDIA)    Difficulty of Paying Living Expenses: Not hard at all  Food Insecurity: No Food Insecurity (06/30/2022)   Hunger Vital Sign    Worried About Running Out of Food in the Last Year: Never true    Ran Out of Food in the Last Year: Never true  Transportation Needs: No Transportation Needs (06/30/2022)   PRAPARE - Hydrologist (Medical): No    Lack of Transportation (Non-Medical): No  Physical Activity: Sufficiently Active (06/30/2022)   Exercise Vital Sign    Days of Exercise per Week: 3 days    Minutes of Exercise per Session: 50  min  Stress: No Stress Concern Present (06/30/2022)   [place]    Feeling of Stress : Not at all  Social Connections: Moderately Integrated (06/30/2022)   Social Connection and Isolation Panel [NHANES]    Frequency of Communication with Friends and Family: More than three times a week    Frequency of Social Gatherings with Friends and Family: More than three times a week    Attends Religious Services: More than 4 times per year    Active Member of Clubs or Organizations: Yes    Attends Music therapist:  More than 4 times per year    Marital Status: Divorced    Family History  Problem Relation Age of Onset   Heart attack Mother    Hypertension Mother    Diabetes Mother    Cancer Father        Prostate cancer   Heart disease Father    Hypertension Brother     Health Maintenance  Topic Date Due   Pneumonia Vaccine 27+ Years old (1 - PCV) 11/26/2022 (Originally 03/30/2019)   Zoster Vaccines- Shingrix (1 of 2) 12/24/2022 (Originally 03/29/1973)   COVID-19 Vaccine (3 - Pfizer risk series) 01/22/2023 (Originally 06/30/2020)   INFLUENZA VACCINE  03/22/2023 (Originally 07/22/2022)   COLONOSCOPY (Pts 45-43yr Insurance coverage will need to be confirmed)  07/01/2023 (Originally 04/10/2014)   TETANUS/TDAP  07/01/2023 (Originally 03/29/1973)   Hepatitis C Screening  07/01/2023 (Originally 03/29/1972)   HEMOGLOBIN A1C  03/18/2023   FOOT EXAM  03/22/2023   OPHTHALMOLOGY EXAM  07/11/2023   Diabetic kidney evaluation - GFR measurement  09/18/2023   Diabetic kidney evaluation - Urine ACR  09/18/2023   MAMMOGRAM  07/10/2024   DEXA SCAN  07/17/2024   HPV VACCINES  Aged Out     ----------------------------------------------------------------------------------------------------------------------------------------------------------------------------------------------------------------- Physical Exam BP 108/68 (BP Location: Left Arm, Patient Position: Sitting, Cuff Size: Large)   Pulse 86   Ht '5\' 2"'$  (1.575 m)   Wt 220 lb (99.8 kg)   SpO2 98%   BMI 40.24 kg/m   Physical Exam Constitutional:      General: She is not in acute distress. HENT:     Head: Normocephalic and atraumatic.     Right Ear: Tympanic membrane and ear canal normal.     Left Ear: Tympanic membrane and ear canal normal.     Nose: Nose normal.  Eyes:     General: No scleral icterus.    Conjunctiva/sclera: Conjunctivae normal.  Neck:     Thyroid: No thyromegaly.  Cardiovascular:     Rate and Rhythm: Normal rate and regular  rhythm.     Heart sounds: Normal heart sounds.  Pulmonary:     Effort: Pulmonary effort is normal.     Breath sounds: Normal breath sounds.  Abdominal:     General: Bowel sounds are normal. There is no distension.     Palpations: Abdomen is soft.     Tenderness: There is no abdominal tenderness. There is no guarding.  Musculoskeletal:        General: Normal range of motion.     Cervical back: Normal range of motion and neck supple.  Lymphadenopathy:     Cervical: No cervical adenopathy.  Skin:    General: Skin is warm and dry.     Findings: No rash.  Neurological:     General: No focal deficit present.     Mental Status: She is alert and oriented to person, place,  and time.     Cranial Nerves: No cranial nerve deficit.     Coordination: Coordination normal.  Psychiatric:        Mood and Affect: Mood normal.        Behavior: Behavior normal.     ------------------------------------------------------------------------------------------------------------------------------------------------------------------------------------------------------------------- Assessment and Plan  Type 2 diabetes mellitus with obesity (HCC) A1c has increased slightly since last visit.  Recommend continue work on diet exercise changes.  Well adult exam Well adult Recent labs reviewed. Screenings: Colon cancer screening recommended however she is not interested in having this done.  She understands risks of not undergoing screening. Immunizations: Declines all recommended immunizations including pneumonia, influenza and Shingrix vaccine.   No orders of the defined types were placed in this encounter.   Return in about 3 months (around 12/24/2022) for T2DM.    This visit occurred during the SARS-CoV-2 public health emergency.  Safety protocols were in place, including screening questions prior to the visit, additional usage of staff PPE, and extensive cleaning of exam room while observing  appropriate contact time as indicated for disinfecting solutions.

## 2022-09-23 NOTE — Assessment & Plan Note (Signed)
A1c has increased slightly since last visit.  Recommend continue work on diet exercise changes.

## 2022-09-23 NOTE — Patient Instructions (Signed)
Preventive Care 65 Years and Older, Female Preventive care refers to lifestyle choices and visits with your health care provider that can promote health and wellness. Preventive care visits are also called wellness exams. What can I expect for my preventive care visit? Counseling Your health care provider may ask you questions about your: Medical history, including: Past medical problems. Family medical history. Pregnancy and menstrual history. History of falls. Current health, including: Memory and ability to understand (cognition). Emotional well-being. Home life and relationship well-being. Sexual activity and sexual health. Lifestyle, including: Alcohol, nicotine or tobacco, and drug use. Access to firearms. Diet, exercise, and sleep habits. Work and work environment. Sunscreen use. Safety issues such as seatbelt and bike helmet use. Physical exam Your health care provider will check your: Height and weight. These may be used to calculate your BMI (body mass index). BMI is a measurement that tells if you are at a healthy weight. Waist circumference. This measures the distance around your waistline. This measurement also tells if you are at a healthy weight and may help predict your risk of certain diseases, such as type 2 diabetes and high blood pressure. Heart rate and blood pressure. Body temperature. Skin for abnormal spots. What immunizations do I need?  Vaccines are usually given at various ages, according to a schedule. Your health care provider will recommend vaccines for you based on your age, medical history, and lifestyle or other factors, such as travel or where you work. What tests do I need? Screening Your health care provider may recommend screening tests for certain conditions. This may include: Lipid and cholesterol levels. Hepatitis C test. Hepatitis B test. HIV (human immunodeficiency virus) test. STI (sexually transmitted infection) testing, if you are at  risk. Lung cancer screening. Colorectal cancer screening. Diabetes screening. This is done by checking your blood sugar (glucose) after you have not eaten for a while (fasting). Mammogram. Talk with your health care provider about how often you should have regular mammograms. BRCA-related cancer screening. This may be done if you have a family history of breast, ovarian, tubal, or peritoneal cancers. Bone density scan. This is done to screen for osteoporosis. Talk with your health care provider about your test results, treatment options, and if necessary, the need for more tests. Follow these instructions at home: Eating and drinking  Eat a diet that includes fresh fruits and vegetables, whole grains, lean protein, and low-fat dairy products. Limit your intake of foods with high amounts of sugar, saturated fats, and salt. Take vitamin and mineral supplements as recommended by your health care provider. Do not drink alcohol if your health care provider tells you not to drink. If you drink alcohol: Limit how much you have to 0-1 drink a day. Know how much alcohol is in your drink. In the [place] one drink equals one 12 oz bottle of beer (355 mL), one 5 oz glass of wine (148 mL), or one 1 oz glass of hard liquor (44 mL). Lifestyle Brush your teeth every morning and night with fluoride toothpaste. Floss one time each day. Exercise for at least 30 minutes 5 or more days each week. Do not use any products that contain nicotine or tobacco. These products include cigarettes, chewing tobacco, and vaping devices, such as e-cigarettes. If you need help quitting, ask your health care provider. Do not use drugs. If you are sexually active, practice safe sex. Use a condom or other form of protection in order to prevent STIs. Take aspirin only as told by   your health care provider. Make sure that you understand how much to take and what form to take. Work with your health care provider to find out whether it  is safe and beneficial for you to take aspirin daily. Ask your health care provider if you need to take a cholesterol-lowering medicine (statin). Find healthy ways to manage stress, such as: Meditation, yoga, or listening to music. Journaling. Talking to a trusted person. Spending time with friends and family. Minimize exposure to UV radiation to reduce your risk of skin cancer. Safety Always wear your seat belt while driving or riding in a vehicle. Do not drive: If you have been drinking alcohol. Do not ride with someone who has been drinking. When you are tired or distracted. While texting. If you have been using any mind-altering substances or drugs. Wear a helmet and other protective equipment during sports activities. If you have firearms in your house, make sure you follow all gun safety procedures. What's next? Visit your health care provider once a year for an annual wellness visit. Ask your health care provider how often you should have your eyes and teeth checked. Stay up to date on all vaccines. This information is not intended to replace advice given to you by your health care provider. Make sure you discuss any questions you have with your health care provider. Document Revised: 06/05/2021 Document Reviewed: 06/05/2021 Elsevier Patient Education  2023 [employer] Inc.  

## 2022-09-23 NOTE — Assessment & Plan Note (Signed)
Well adult Recent labs reviewed. Screenings: Colon cancer screening recommended however she is not interested in having this done.  She understands risks of not undergoing screening. Immunizations: Declines all recommended immunizations including pneumonia, influenza and Shingrix vaccine.

## 2022-10-21 ENCOUNTER — Other Ambulatory Visit: Payer: Self-pay | Admitting: Family Medicine

## 2022-10-21 DIAGNOSIS — E119 Type 2 diabetes mellitus without complications: Secondary | ICD-10-CM

## 2023-02-20 ENCOUNTER — Other Ambulatory Visit: Payer: Self-pay

## 2023-02-20 DIAGNOSIS — E669 Obesity, unspecified: Secondary | ICD-10-CM

## 2023-02-20 DIAGNOSIS — I10 Essential (primary) hypertension: Secondary | ICD-10-CM

## 2023-02-20 MED ORDER — GLIMEPIRIDE 4 MG PO TABS: 4.0000 mg | ORAL_TABLET | Freq: Two times a day (BID) | ORAL | 0 refills | Status: DC

## 2023-02-20 MED ORDER — TELMISARTAN-HCTZ 40-12.5 MG PO TABS: 1.0000 | ORAL_TABLET | Freq: Every day | ORAL | 0 refills | Status: DC

## 2023-02-23 ENCOUNTER — Encounter: Payer: Self-pay | Admitting: Family Medicine

## 2023-02-23 ENCOUNTER — Ambulatory Visit (INDEPENDENT_AMBULATORY_CARE_PROVIDER_SITE_OTHER): Payer: Medicare HMO | Admitting: Family Medicine

## 2023-02-23 VITALS — BP 117/78 | HR 98 | Ht 60.0 in | Wt 217.0 lb

## 2023-02-23 DIAGNOSIS — E1169 Type 2 diabetes mellitus with other specified complication: Secondary | ICD-10-CM | POA: Diagnosis not present

## 2023-02-23 DIAGNOSIS — E119 Type 2 diabetes mellitus without complications: Secondary | ICD-10-CM

## 2023-02-23 DIAGNOSIS — I10 Essential (primary) hypertension: Secondary | ICD-10-CM | POA: Diagnosis not present

## 2023-02-23 DIAGNOSIS — E669 Obesity, unspecified: Secondary | ICD-10-CM | POA: Diagnosis not present

## 2023-02-23 DIAGNOSIS — E785 Hyperlipidemia, unspecified: Secondary | ICD-10-CM | POA: Diagnosis not present

## 2023-02-23 LAB — POCT GLYCOSYLATED HEMOGLOBIN (HGB A1C): HbA1c, POC (controlled diabetic range): 7.5 % — AB (ref 0.0–7.0)

## 2023-02-23 MED ORDER — METFORMIN HCL 500 MG PO TABS: ORAL_TABLET | ORAL | 3 refills | Status: DC

## 2023-02-23 MED ORDER — PIOGLITAZONE HCL 30 MG PO TABS: 30.0000 mg | ORAL_TABLET | Freq: Every day | ORAL | 3 refills | Status: DC

## 2023-02-23 MED ORDER — EMPAGLIFLOZIN 25 MG PO TABS: ORAL_TABLET | ORAL | 2 refills | Status: DC

## 2023-02-23 MED ORDER — ATORVASTATIN CALCIUM 10 MG PO TABS: 10.0000 mg | ORAL_TABLET | Freq: Every day | ORAL | 3 refills | Status: DC

## 2023-02-23 NOTE — Assessment & Plan Note (Signed)
Continue atorvastatin at current strength.

## 2023-02-23 NOTE — Assessment & Plan Note (Signed)
BP is well controlled. Recommend that she continue telmisartan/hctz at current strength.

## 2023-02-23 NOTE — Progress Notes (Signed)
Mary [name] - 67 y.o. female MRN [id number]  Date of birth: 1956-03-23  Subjective Chief Complaint  Patient presents with   Follow-up    HPI Mary [name] is a 67 y.o. female here today for follow up.   Continues on metformin, jardiance, actos and glimepiride.  She is doing pretty well.  Home glucose readings averaging around 140.  Denies symptoms of hypoglycemia.  She has worked on dietary changes and weight is down about 3-4 lbs.  Tolerating atorvastatin well for associated HLD.    BP is treated with telmisartan/hctz.  BP is well controlled..  She reports that she is doing well with current medications.  She has not had chest pain, shortness of breath, palpitations, headache or vision  She is requesting a letter to defer jury duty due to knee pain.   ROS:  A comprehensive ROS was completed and negative except as noted per HPI  Allergies  Allergen Reactions   Codeine Nausea And Vomiting    Past Medical History:  Diagnosis Date   Breast cancer ([place]) 10/2008   Left breast invasive ductal carcinoma   Diabetes mellitus without complication (HCC)    GERD (gastroesophageal reflux disease)    Hyperlipidemia    Hypertension    Osteopenia     Past Surgical History:  Procedure Laterality Date   ABDOMINAL HYSTERECTOMY  11/1998   BREAST LUMPECTOMY WITH AXILLARY LYMPH NODE BIOPSY Left    Lumpectomy in 11/2008 and axillary biopsy in 12/2008   DILATION AND CURETTAGE OF UTERUS  08/1998    Social History   Socioeconomic History   Marital status: Divorced    Spouse name: Not on file   Number of children: 0   Years of education: 13   Highest education level: Some college, no degree  Occupational History    Comment: Retired  Tobacco Use   Smoking status: Former   Smokeless tobacco: Never   Tobacco comments:    Quit smoking in 1990  Substance and Sexual Activity   Alcohol use: No   Drug use: No   Sexual activity: Never    Birth control/protection: Surgical, Post-menopausal   Other Topics Concern   Not on file  Social History Narrative   Lives alone. Enjoys reading, doing yard work, church activities & silver sneakers.   Social Determinants of Health   Financial Resource Strain: Low Risk  (06/30/2022)   Overall Financial Resource Strain (CARDIA)    Difficulty of Paying Living Expenses: Not hard at all  Food Insecurity: No Food Insecurity (06/30/2022)   Hunger Vital Sign    Worried About Running Out of Food in the Last Year: Never true    Ran Out of Food in the Last Year: Never true  Transportation Needs: No Transportation Needs (06/30/2022)   PRAPARE - Hydrologist (Medical): No    Lack of Transportation (Non-Medical): No  Physical Activity: Sufficiently Active (06/30/2022)   Exercise Vital Sign    Days of Exercise per Week: 3 days    Minutes of Exercise per Session: 50 min  Stress: No Stress Concern Present (06/30/2022)   [place]    Feeling of Stress : Not at all  Social Connections: Moderately Integrated (06/30/2022)   Social Connection and Isolation Panel [NHANES]    Frequency of Communication with Friends and Family: More than three times a week    Frequency of Social Gatherings with Friends and Family: More than three times a week  Attends Religious Services: More than 4 times per year    Active Member of Clubs or Organizations: Yes    Attends Archivist Meetings: More than 4 times per year    Marital Status: Divorced    Family History  Problem Relation Age of Onset   Heart attack Mother    Hypertension Mother    Diabetes Mother    Cancer Father        Prostate cancer   Heart disease Father    Hypertension Brother     Health Maintenance  Topic Date Due   DTaP/Tdap/Td (1 - Tdap) Never done   COVID-19 Vaccine (3 - Pfizer risk series) 03/11/2023 (Originally 06/30/2020)   INFLUENZA VACCINE  03/22/2023 (Originally 07/22/2022)   Zoster  Vaccines- Shingrix (1 of 2) 05/26/2023 (Originally 03/29/1973)   COLONOSCOPY (Pts 45-23yr Insurance coverage will need to be confirmed)  07/01/2023 (Originally 04/10/2014)   Hepatitis C Screening  07/01/2023 (Originally 03/29/1972)   Pneumonia Vaccine 69 Years old (1 of 1 - PCV) 02/23/2024 (Originally 03/30/2019)   HEMOGLOBIN A1C  03/18/2023   FOOT EXAM  03/22/2023   Medicare Annual Wellness (AWV)  07/01/2023   OPHTHALMOLOGY EXAM  07/11/2023   Diabetic kidney evaluation - eGFR measurement  09/18/2023   Diabetic kidney evaluation - Urine ACR  09/18/2023   MAMMOGRAM  07/10/2024   DEXA SCAN  07/17/2024   HPV VACCINES  Aged Out     ----------------------------------------------------------------------------------------------------------------------------------------------------------------------------------------------------------------- Physical Exam BP 117/78   Pulse 98   Ht 5' (1.524 m)   Wt 217 lb (98.4 kg)   SpO2 99%   BMI 42.38 kg/m   Physical Exam Constitutional:      Appearance: Normal appearance.  HENT:     Head: Normocephalic and atraumatic.  Eyes:     General: No scleral icterus. Neurological:     General: No focal deficit present.     Mental Status: She is alert.  Psychiatric:        Mood and Affect: Mood normal.        Behavior: Behavior normal.     ------------------------------------------------------------------------------------------------------------------------------------------------------------------------------------------------------------------- Assessment and Plan  Essential hypertension BP is well controlled. Recommend that she continue telmisartan/hctz at current strength.    Type 2 diabetes mellitus with obesity ([place] She is working on dietary changes.  Encouraged continuation.  Continue current medications.  She is planning on adding recumbent bike and I encouraged this to help with glucose and BP control.   Hyperlipidemia associated with type  2 diabetes mellitus (HCC) Continue atorvastatin at current strength.    Meds ordered this encounter  Medications   atorvastatin (LIPITOR) 10 MG tablet    Sig: Take 1 tablet (10 mg total) by mouth daily.    Dispense:  90 tablet    Refill:  3   empagliflozin (JARDIANCE) 25 MG TABS tablet    Sig: TAKE ONE TABLET BY MOUTH DAILY BEFORE BREAKFAST    Dispense:  90 tablet    Refill:  2   metFORMIN (GLUCOPHAGE) 500 MG tablet    Sig: TAKE TWO TABLETS BY MOUTH TWICE A DAY WITH A MEAL    Dispense:  360 tablet    Refill:  3   pioglitazone (ACTOS) 30 MG tablet    Sig: Take 1 tablet (30 mg total) by mouth daily.    Dispense:  90 tablet    Refill:  3    Return in about 6 months (around 08/26/2023) for HTN/T2DM.    This visit occurred during the SARS-CoV-2 public  health emergency.  Safety protocols were in place, including screening questions prior to the visit, additional usage of staff PPE, and extensive cleaning of exam room while observing appropriate contact time as indicated for disinfecting solutions.

## 2023-02-23 NOTE — Assessment & Plan Note (Signed)
She is working on dietary changes.  Encouraged continuation.  Continue current medications.  She is planning on adding recumbent bike and I encouraged this to help with glucose and BP control.

## 2023-05-05 ENCOUNTER — Other Ambulatory Visit: Payer: Self-pay | Admitting: Family Medicine

## 2023-05-05 DIAGNOSIS — E119 Type 2 diabetes mellitus without complications: Secondary | ICD-10-CM

## 2023-05-18 ENCOUNTER — Other Ambulatory Visit: Payer: Self-pay | Admitting: Family Medicine

## 2023-05-18 DIAGNOSIS — E1169 Type 2 diabetes mellitus with other specified complication: Secondary | ICD-10-CM

## 2023-05-18 DIAGNOSIS — I10 Essential (primary) hypertension: Secondary | ICD-10-CM

## 2023-06-12 ENCOUNTER — Other Ambulatory Visit: Payer: Self-pay | Admitting: Family Medicine

## 2023-06-12 DIAGNOSIS — E119 Type 2 diabetes mellitus without complications: Secondary | ICD-10-CM

## 2023-06-17 ENCOUNTER — Other Ambulatory Visit: Payer: Self-pay

## 2023-06-17 DIAGNOSIS — E119 Type 2 diabetes mellitus without complications: Secondary | ICD-10-CM

## 2023-06-17 MED ORDER — ONETOUCH VERIO VI STRP: ORAL_STRIP | 3 refills | Status: DC

## 2023-07-13 ENCOUNTER — Ambulatory Visit (INDEPENDENT_AMBULATORY_CARE_PROVIDER_SITE_OTHER): Payer: Medicare HMO | Admitting: Family Medicine

## 2023-07-13 VITALS — Ht 61.75 in | Wt 210.0 lb

## 2023-07-13 DIAGNOSIS — Z0000 Encounter for general adult medical examination without abnormal findings: Secondary | ICD-10-CM | POA: Diagnosis not present

## 2023-07-13 NOTE — Progress Notes (Signed)
MEDICARE ANNUAL WELLNESS VISIT  07/13/2023  Telephone Visit Disclaimer This Medicare AWV was conducted by telephone due to national recommendations for restrictions regarding the COVID-19 Pandemic (e.g. social distancing).  I verified, using two identifiers, that I am speaking with Mary [name] or their authorized healthcare agent. I discussed the limitations, risks, security, and privacy concerns of performing an evaluation and management service by telephone and the potential availability of an in-person appointment in the future. The patient expressed understanding and agreed to proceed.  Location of Patient: Home Location of Provider (nurse):  Provider home  Subjective:    Mary [name] is a 67 y.o. female patient of Everrett Coombe, DO who had a Medicare Annual Wellness Visit today via telephone. Kierra is Retired and lives alone. she does not have any children. she reports that she is socially active and does interact with friends/family regularly. she is moderately physically active and enjoys reading, yard work, church activities, get together with friends and silver sneakers.  Patient Care Team: Everrett Coombe, DO as PCP - General (Family Medicine)     07/13/2023    3:31 PM 06/30/2022    2:58 PM 05/23/2021    2:12 PM  Advanced Directives  Does Patient Have a Medical Advance Directive? Yes Yes No  Type of Advance Directive Living will Living will   Does patient want to make changes to medical advance directive? No - Patient declined No - Patient declined   Would patient like information on creating a medical advance directive?   No - Patient declined    Hospital Utilization Over the Past 12 Months: # of hospitalizations or ER visits: 0 # of surgeries: 0  Review of Systems    Patient reports that her overall health is unchanged compared to last year.  History obtained from chart review and the patient  Patient Reported Readings (BP, Pulse, CBG, Weight, etc) Weight: 210  lb Height: 11f1.75 in  Per patient no change in vitals since last visit, unable to obtain new vitals due to telehealth visit  Pain Assessment Pain : No/denies pain     Current Medications & Allergies (verified) Allergies as of 07/13/2023       Reactions   Codeine Nausea And Vomiting        Medication List        Accurate as of July 13, 2023  3:58 PM. If you have any questions, ask your nurse or doctor.          aspirin 81 MG tablet Take 81 mg by mouth daily.   atorvastatin 10 MG tablet Commonly known as: LIPITOR Take 1 tablet (10 mg total) by mouth daily.   blood glucose meter kit and supplies Dispense based on patient and insurance preference. Use up to four times daily as directed. (FOR ICD-10 E10.9, E11.9).   empagliflozin 25 MG Tabs tablet Commonly known as: Jardiance TAKE ONE TABLET BY MOUTH DAILY BEFORE BREAKFAST   esomeprazole 40 MG capsule Commonly known as: NEXIUM TAKE ONE CAPSULE BY MOUTH DAILY   glimepiride 4 MG tablet Commonly known as: AMARYL TAKE 1 TABLET BY MOUTH TWICE A DAY   metFORMIN 500 MG tablet Commonly known as: GLUCOPHAGE TAKE TWO TABLETS BY MOUTH TWICE A DAY WITH A MEAL   Microlet Lancets Misc USE TO CHECK BLOOD SUGAR LEVELS UP TO FOUR TIMES A DAY AS DIRECTED   multivitamin tablet Take 1 tablet by mouth daily.   OneTouch Verio test strip Generic drug: glucose blood USE AS DIRECTED TO  CHECK BLOOD ONCE DAILY   pioglitazone 30 MG tablet Commonly known as: ACTOS Take 1 tablet (30 mg total) by mouth daily.   telmisartan-hydrochlorothiazide 40-12.5 MG tablet Commonly known as: MICARDIS HCT TAKE 1 TABLET BY MOUTH DAILY   Vitamin D3 50 MCG (2000 UT) Tabs Take 1 tablet by mouth 2 (two) times daily.        History (reviewed): Past Medical History:  Diagnosis Date   Breast cancer (HCC) 10/2008   Left breast invasive ductal carcinoma   Diabetes mellitus without complication (HCC)    GERD (gastroesophageal reflux disease)     Hyperlipidemia    Hypertension    Osteopenia    Past Surgical History:  Procedure Laterality Date   ABDOMINAL HYSTERECTOMY  11/1998   BREAST LUMPECTOMY WITH AXILLARY LYMPH NODE BIOPSY Left    Lumpectomy in 11/2008 and axillary biopsy in 12/2008   DILATION AND CURETTAGE OF UTERUS  08/1998   Family History  Problem Relation Age of Onset   Heart attack Mother    Hypertension Mother    Diabetes Mother    Cancer Father        Prostate cancer   Heart disease Father    Hypertension Brother    Social History   Socioeconomic History   Marital status: Divorced    Spouse name: Not on file   Number of children: 0   Years of education: 13   Highest education level: Some college, no degree  Occupational History    Comment: Retired  Tobacco Use   Smoking status: Former   Smokeless tobacco: Never   Tobacco comments:    Quit smoking in 1990  Vaping Use   Vaping status: Never Used  Substance and Sexual Activity   Alcohol use: No   Drug use: Never   Sexual activity: Never    Birth control/protection: Surgical, Post-menopausal  Other Topics Concern   Not on file  Social History Narrative   Lives alone. Enjoys reading, doing yard work, church activities & silver sneakers.   Social Determinants of Health   Financial Resource Strain: Low Risk  (07/13/2023)   Overall Financial Resource Strain (CARDIA)    Difficulty of Paying Living Expenses: Not hard at all  Food Insecurity: No Food Insecurity (07/13/2023)   Hunger Vital Sign    Worried About Running Out of Food in the Last Year: Never true    Ran Out of Food in the Last Year: Never true  Transportation Needs: No Transportation Needs (07/13/2023)   PRAPARE - Administrator, Civil Service (Medical): No    Lack of Transportation (Non-Medical): No  Physical Activity: Sufficiently Active (07/13/2023)   Exercise Vital Sign    Days of Exercise per Week: 3 days    Minutes of Exercise per Session: 50 min  Stress: No Stress  Concern Present (07/13/2023)   [employer] of Occupational Health - Occupational Stress Questionnaire    Feeling of Stress : Not at all  Social Connections: Moderately Integrated (07/13/2023)   Social Connection and Isolation Panel [NHANES]    Frequency of Communication with Friends and Family: More than three times a week    Frequency of Social Gatherings with Friends and Family: More than three times a week    Attends Religious Services: More than 4 times per year    Active Member of [employer] or Organizations: Yes    Attends Engineer, structural: More than 4 times per year    Marital Status: Divorced  Activities of Daily Living    07/13/2023    3:41 PM  In your present state of health, do you have any difficulty performing the following activities:  Hearing? 0  Vision? 0  Difficulty concentrating or making decisions? 0  Walking or climbing stairs? 0  Dressing or bathing? 0  Doing errands, shopping? 0  Preparing Food and eating ? N  Using the Toilet? N  In the past six months, have you accidently leaked urine? N  Do you have problems with loss of bowel control? N  Managing your Medications? N  Managing your Finances? N  Housekeeping or managing your Housekeeping? N    Patient Education/ Literacy How often do you need to have someone help you when you read instructions, pamphlets, or other written materials from your doctor or pharmacy?: 1 - Never What is the last grade level you completed in school?: 12th grade and some community college  Exercise    Diet Patient reports consuming 3 meals a day and 2 snack(s) a day Patient reports that her primary diet is: Regular Patient reports that she does have regular access to food.   Depression Screen    07/13/2023    3:36 PM 02/23/2023    8:54 AM 06/30/2022    3:01 PM 03/21/2022    8:21 AM 11/26/2021    9:00 AM 05/23/2021    2:17 PM 09/11/2020    9:06 AM  PHQ 2/9 Scores  PHQ - 2 Score 0 0 0 0 0 0 0     Fall  Risk    07/13/2023    3:36 PM 02/23/2023    8:54 AM 06/30/2022    3:00 PM 03/21/2022    8:21 AM 11/26/2021    8:59 AM  Fall Risk   Falls in the past year? 0 0 0 0 0  Number falls in past yr: 0 0 0  0  Injury with Fall? 0 0 0 0 0  Risk for fall due to : No Fall Risks  No Fall Risks No Fall Risks No Fall Risks  Follow up Falls evaluation completed  Falls evaluation completed Falls evaluation completed Falls evaluation completed     Objective:  Evadna Donaghy seemed alert and oriented and she participated appropriately during our telephone visit.  Blood Pressure Weight BMI  BP Readings from Last 3 Encounters:  02/23/23 117/78  09/23/22 108/68  03/21/22 107/71   Wt Readings from Last 3 Encounters:  07/13/23 210 lb (95.3 kg)  02/23/23 217 lb (98.4 kg)  09/23/22 220 lb (99.8 kg)   BMI Readings from Last 1 Encounters:  07/13/23 38.72 kg/m    *Unable to obtain current vital signs, weight, and BMI due to telephone visit type  Hearing/Vision  Lane did not seem to have difficulty with hearing/understanding during the telephone conversation Reports that she has had a formal eye exam by an eye care professional within the past year Reports that she has not had a formal hearing evaluation within the past year *Unable to fully assess hearing and vision during telephone visit type  Cognitive Function:    07/13/2023    3:44 PM 06/30/2022    3:12 PM 05/23/2021    2:30 PM  6CIT Screen  What Year? 0 points 0 points 0 points  What month? 0 points 0 points 0 points  What time? 0 points 0 points 0 points  Count back from 20 0 points 0 points 0 points  Months in reverse 0 points 0 points 0  points  Repeat phrase 0 points 0 points 0 points  Total Score 0 points 0 points 0 points   (Normal:0-7, Significant for Dysfunction: >8)  Normal Cognitive Function Screening: Yes   Immunization & Health Maintenance Record Immunization History  Administered Date(s) Administered   PFIZER(Purple  Top)SARS-COV-2 Vaccination 05/12/2020, 06/02/2020    Health Maintenance  Topic Date Due   DTaP/Tdap/Td (1 - Tdap) Never done   OPHTHALMOLOGY EXAM  07/13/2023 (Originally 07/11/2023)   FOOT EXAM  07/16/2023 (Originally 03/22/2023)   COVID-19 Vaccine (3 - Pfizer risk series) 07/29/2023 (Originally 06/30/2020)   Zoster Vaccines- Shingrix (1 of 2) 10/13/2023 (Originally 03/29/1973)   Pneumonia Vaccine 52+ Years old (1 of 1 - PCV) 02/23/2024 (Originally 03/30/2019)   Colonoscopy  07/12/2024 (Originally 04/10/2014)   Hepatitis C Screening  07/12/2024 (Originally 03/29/1972)   INFLUENZA VACCINE  07/23/2023   HEMOGLOBIN A1C  08/26/2023   Diabetic kidney evaluation - eGFR measurement  09/18/2023   Diabetic kidney evaluation - Urine ACR  09/18/2023   MAMMOGRAM  07/10/2024   Medicare Annual Wellness (AWV)  07/12/2024   DEXA SCAN  07/17/2024   HPV VACCINES  Aged Out       Assessment  This is a routine wellness examination for [employer].  Health Maintenance: Due or Overdue Health Maintenance Due  Topic Date Due   DTaP/Tdap/Td (1 - Tdap) Never done    Teisha Trowbridge does not need a referral for Community Assistance: Care Management:   no Social Work:    no Prescription Assistance:  no Nutrition/Diabetes Education:  no   Plan:  Personalized Goals  Goals Addressed               This Visit's Progress     Patient Stated (pt-stated)        Patient stated that she would like to loose weight and go to silver sneakers 3 times a week.        Personalized Health Maintenance & Screening Recommendations  Eye exam  - Due in August.  TD vaccine- patient stated that she had one in 2018.  Shingles vaccine- patient declined Pneumonia vaccine - patient declined Colonoscopy- discuss with dr. Ashley Royalty in September.  Mammogram - discuss with Dr. Matthews/insurance and schedule Dexa scan- due in 2025.    Lung Cancer Screening Recommended: no (Low Dose CT Chest recommended if Age 49-80 years,  20 pack-year currently smoking OR have quit w/in past 15 years) Hepatitis C Screening recommended: no HIV Screening recommended: no  Advanced Directives: Written information was not prepared per patient's request.  Referrals & Orders No orders of the defined types were placed in this encounter.   Follow-up Plan Follow-up with Everrett Coombe, DO as planned Medicare wellness visit in one year.  Patient will access AVS on my chart.   I have personally reviewed and noted the following in the patient's chart:   Medical and social history Use of alcohol, tobacco or illicit drugs  Current medications and supplements Functional ability and status Nutritional status Physical activity Advanced directives List of other physicians Hospitalizations, surgeries, and ER visits in previous 12 months Vitals Screenings to include cognitive, depression, and falls Referrals and appointments  In addition, I have reviewed and discussed with Mary [name] certain preventive protocols, quality metrics, and best practice recommendations. A written personalized care plan for preventive services as well as general preventive health recommendations is available and can be mailed to the patient at her request.      Modesto Charon, RN  BSN  07/13/2023

## 2023-07-13 NOTE — Patient Instructions (Addendum)
MEDICARE ANNUAL WELLNESS VISIT Health Maintenance Summary and Written Plan of Care  Ms. Mary [name] ,  Thank you for allowing me to perform your Medicare Annual Wellness Visit and for your ongoing commitment to your health.   Health Maintenance & Immunization History Health Maintenance  Topic Date Due   DTaP/Tdap/Td (1 - Tdap) Never done   OPHTHALMOLOGY EXAM  07/13/2023 (Originally 07/11/2023)   FOOT EXAM  07/16/2023 (Originally 03/22/2023)   COVID-19 Vaccine (3 - Pfizer risk series) 07/29/2023 (Originally 06/30/2020)   Zoster Vaccines- Shingrix (1 of 2) 10/13/2023 (Originally 03/29/1973)   Pneumonia Vaccine 29+ Years old (1 of 1 - PCV) 02/23/2024 (Originally 03/30/2019)   Colonoscopy  07/12/2024 (Originally 04/10/2014)   Hepatitis C Screening  07/12/2024 (Originally 03/29/1972)   INFLUENZA VACCINE  07/23/2023   HEMOGLOBIN A1C  08/26/2023   Diabetic kidney evaluation - eGFR measurement  09/18/2023   Diabetic kidney evaluation - Urine ACR  09/18/2023   MAMMOGRAM  07/10/2024   Medicare Annual Wellness (AWV)  07/12/2024   DEXA SCAN  07/17/2024   HPV VACCINES  Aged Out   Immunization History  Administered Date(s) Administered   PFIZER(Purple Top)SARS-COV-2 Vaccination 05/12/2020, 06/02/2020    These are the patient goals that we discussed:  Goals Addressed               This Visit's Progress     Patient Stated (pt-stated)        Patient stated that she would like to loose weight and go to silver sneakers 3 times a week.          This is a list of Health Maintenance Items that are overdue or due now: Health Maintenance Due  Topic Date Due   DTaP/Tdap/Td (1 - Tdap) Never done   Eye exam -Due in August.  TD vaccine- patient stated that she had one in 2018.  Shingles vaccine- patient declined Pneumonia vaccine - patient declined Colonoscopy- discuss with dr. Ashley Royalty in September.  Mammogram - discuss with Dr. Matthews/insurance and schedule Dexa scan- due in 2025.    Orders/Referrals Placed Today: No orders of the defined types were placed in this encounter.  (Contact our referral department at [phone] if you have not spoken with someone about your referral appointment within the next 5 days)    Follow-up Plan Follow-up with Everrett Coombe, DO as planned Medicare wellness visit in one year.  Patient will access AVS on my chart.      Health Maintenance, Female Adopting a healthy lifestyle and getting preventive care are important in promoting health and wellness. Ask your health care provider about: The right schedule for you to have regular tests and exams. Things you can do on your own to prevent diseases and keep yourself healthy. What should I know about diet, weight, and exercise? Eat a healthy diet  Eat a diet that includes plenty of vegetables, fruits, low-fat dairy products, and lean protein. Do not eat a lot of foods that are high in solid fats, added sugars, or sodium. Maintain a healthy weight Body mass index (BMI) is used to identify weight problems. It estimates body fat based on height and weight. Your health care provider can help determine your BMI and help you achieve or maintain a healthy weight. Get regular exercise Get regular exercise. This is one of the most important things you can do for your health. Most adults should: Exercise for at least 150 minutes each week. The exercise should increase your heart rate and make you sweat (  moderate-intensity exercise). Do strengthening exercises at least twice a week. This is in addition to the moderate-intensity exercise. Spend less time sitting. Even light physical activity can be beneficial. Watch cholesterol and blood lipids Have your blood tested for lipids and cholesterol at 69 years of age, then have this test every 5 years. Have your cholesterol levels checked more often if: Your lipid or cholesterol levels are high. You are older than 69 years of age. You are at  high risk for heart disease. What should I know about cancer screening? Depending on your health history and family history, you may need to have cancer screening at various ages. This may include screening for: Breast cancer. Cervical cancer. Colorectal cancer. Skin cancer. Lung cancer. What should I know about heart disease, diabetes, and high blood pressure? Blood pressure and heart disease High blood pressure causes heart disease and increases the risk of stroke. This is more likely to develop in people who have high blood pressure readings or are overweight. Have your blood pressure checked: Every 3-5 years if you are 20-23 years of age. Every year if you are 70 years old or older. Diabetes Have regular diabetes screenings. This checks your fasting blood sugar level. Have the screening done: Once every three years after age 51 if you are at a normal weight and have a low risk for diabetes. More often and at a younger age if you are overweight or have a high risk for diabetes. What should I know about preventing infection? Hepatitis B If you have a higher risk for hepatitis B, you should be screened for this virus. Talk with your health care provider to find out if you are at risk for hepatitis B infection. Hepatitis C Testing is recommended for: Everyone born from [birth year] through 1967. Anyone with known risk factors for hepatitis C. Sexually transmitted infections (STIs) Get screened for STIs, including gonorrhea and chlamydia, if: You are sexually active and are younger than 69 years of age. You are older than 69 years of age and your health care provider tells you that you are at risk for this type of infection. Your sexual activity has changed since you were last screened, and you are at increased risk for chlamydia or gonorrhea. Ask your health care provider if you are at risk. Ask your health care provider about whether you are at high risk for HIV. Your health care provider may  recommend a prescription medicine to help prevent HIV infection. If you choose to take medicine to prevent HIV, you should first get tested for HIV. You should then be tested every 3 months for as long as you are taking the medicine. Pregnancy If you are about to stop having your period (premenopausal) and you may become pregnant, seek counseling before you get pregnant. Take 400 to 800 micrograms (mcg) of folic acid every day if you become pregnant. Ask for birth control (contraception) if you want to prevent pregnancy. Osteoporosis and menopause Osteoporosis is a disease in which the bones lose minerals and strength with aging. This can result in bone fractures. If you are 7 years old or older, or if you are at risk for osteoporosis and fractures, ask your health care provider if you should: Be screened for bone loss. Take a calcium or vitamin D supplement to lower your risk of fractures. Be given hormone replacement therapy (HRT) to treat symptoms of menopause. Follow these instructions at home: Alcohol use Do not drink alcohol if: Your health care provider tells  you not to drink. You are pregnant, may be pregnant, or are planning to become pregnant. If you drink alcohol: Limit how much you have to: 0-1 drink a day. Know how much alcohol is in your drink. In the [place] one drink equals one 12 oz bottle of beer (355 mL), one 5 oz glass of wine (148 mL), or one 1 oz glass of hard liquor (44 mL). Lifestyle Do not use any products that contain nicotine or tobacco. These products include cigarettes, chewing tobacco, and vaping devices, such as e-cigarettes. If you need help quitting, ask your health care provider. Do not use street drugs. Do not share needles. Ask your health care provider for help if you need support or information about quitting drugs. General instructions Schedule regular health, dental, and eye exams. Stay current with your vaccines. Tell your health care provider  if: You often feel depressed. You have ever been abused or do not feel safe at home. Summary Adopting a healthy lifestyle and getting preventive care are important in promoting health and wellness. Follow your health care provider's instructions about healthy diet, exercising, and getting tested or screened for diseases. Follow your health care provider's instructions on monitoring your cholesterol and blood pressure. This information is not intended to replace advice given to you by your health care provider. Make sure you discuss any questions you have with your health care provider. Document Revised: 04/29/2021 Document Reviewed: 04/29/2021 Elsevier Patient Education  2024 [employer].

## 2023-07-20 ENCOUNTER — Other Ambulatory Visit: Payer: Self-pay | Admitting: Family Medicine

## 2023-07-20 DIAGNOSIS — Z1231 Encounter for screening mammogram for malignant neoplasm of breast: Secondary | ICD-10-CM

## 2023-07-22 ENCOUNTER — Other Ambulatory Visit: Payer: Self-pay | Admitting: Family Medicine

## 2023-07-22 DIAGNOSIS — E119 Type 2 diabetes mellitus without complications: Secondary | ICD-10-CM

## 2023-08-06 ENCOUNTER — Ambulatory Visit
Admission: RE | Admit: 2023-08-06 | Discharge: 2023-08-06 | Disposition: A | Discharge status: Home / Self Care | Payer: Medicare HMO | Source: Ambulatory Visit | Attending: Family Medicine | Admitting: Family Medicine

## 2023-08-06 DIAGNOSIS — Z1231 Encounter for screening mammogram for malignant neoplasm of breast: Secondary | ICD-10-CM

## 2023-08-12 DIAGNOSIS — H2513 Age-related nuclear cataract, bilateral: Secondary | ICD-10-CM | POA: Diagnosis not present

## 2023-08-12 DIAGNOSIS — H524 Presbyopia: Secondary | ICD-10-CM | POA: Diagnosis not present

## 2023-08-12 DIAGNOSIS — E119 Type 2 diabetes mellitus without complications: Secondary | ICD-10-CM | POA: Diagnosis not present

## 2023-08-12 DIAGNOSIS — H40013 Open angle with borderline findings, low risk, bilateral: Secondary | ICD-10-CM | POA: Diagnosis not present

## 2023-08-12 DIAGNOSIS — H35033 Hypertensive retinopathy, bilateral: Secondary | ICD-10-CM | POA: Diagnosis not present

## 2023-08-12 LAB — HM DIABETES EYE EXAM

## 2023-08-15 ENCOUNTER — Other Ambulatory Visit: Payer: Self-pay | Admitting: Family Medicine

## 2023-08-15 DIAGNOSIS — E669 Obesity, unspecified: Secondary | ICD-10-CM

## 2023-08-26 ENCOUNTER — Encounter: Payer: Self-pay | Admitting: Family Medicine

## 2023-08-26 ENCOUNTER — Ambulatory Visit (INDEPENDENT_AMBULATORY_CARE_PROVIDER_SITE_OTHER): Payer: Medicare HMO | Admitting: Family Medicine

## 2023-08-26 VITALS — BP 101/67 | HR 82 | Ht 61.75 in | Wt 210.0 lb

## 2023-08-26 DIAGNOSIS — E1169 Type 2 diabetes mellitus with other specified complication: Secondary | ICD-10-CM

## 2023-08-26 DIAGNOSIS — Z7984 Long term (current) use of oral hypoglycemic drugs: Secondary | ICD-10-CM | POA: Diagnosis not present

## 2023-08-26 DIAGNOSIS — I10 Essential (primary) hypertension: Secondary | ICD-10-CM

## 2023-08-26 DIAGNOSIS — E669 Obesity, unspecified: Secondary | ICD-10-CM | POA: Diagnosis not present

## 2023-08-26 DIAGNOSIS — E785 Hyperlipidemia, unspecified: Secondary | ICD-10-CM

## 2023-08-26 LAB — POCT GLYCOSYLATED HEMOGLOBIN (HGB A1C): HbA1c, POC (controlled diabetic range): 7.2 % — AB (ref 0.0–7.0)

## 2023-08-26 NOTE — Assessment & Plan Note (Signed)
BP is well controlled. Recommend that she continue telmisartan/hctz at current strength.

## 2023-08-26 NOTE — Patient Instructions (Signed)
Have labs completed prior to Annual exam

## 2023-08-26 NOTE — Progress Notes (Signed)
Mary [name] - 67 y.o. female MRN [id number]  Date of birth: [birth year]  Subjective Chief Complaint  Patient presents with   Hypertension   Diabetes    HPI Mary [name] is a 67 y.o. female here today for follow up visit.   HTN is treated with telmisartan/hydrochlorothiazide.  She is tolerating well.  BP has been well controlled. Denies dizziness, chest pain, shortness of breath, palpitations, headache or vision changes.   Diabetes is managed with combination of actos, metformin, glimepiride and jardiance.  Tolerating this pretty well at this time.  No significant side effects at this time.  Remains on atorvastatin for associated HLD.  Her activity level is pretty good.  She is doing a silver sneakers program.   Weight is down about 7 lbs since March.    ROS:  A comprehensive ROS was completed and negative except as noted per HPI  Allergies  Allergen Reactions   Codeine Nausea And Vomiting    Past Medical History:  Diagnosis Date   Breast cancer (HCC) 10/2008   Left breast invasive ductal carcinoma   Diabetes mellitus without complication (HCC)    GERD (gastroesophageal reflux disease)    Hyperlipidemia    Hypertension    Osteopenia     Past Surgical History:  Procedure Laterality Date   ABDOMINAL HYSTERECTOMY  11/1998   BREAST LUMPECTOMY WITH AXILLARY LYMPH NODE BIOPSY Left    Lumpectomy in 11/2008 and axillary biopsy in 12/2008   DILATION AND CURETTAGE OF UTERUS  08/1998    Social History   Socioeconomic History   Marital status: Divorced    Spouse name: Not on file   Number of children: 0   Years of education: 13   Highest education level: Some college, no degree  Occupational History    Comment: Retired  Tobacco Use   Smoking status: Former   Smokeless tobacco: Never   Tobacco comments:    Quit smoking in 1990  Vaping Use   Vaping status: Never Used  Substance and Sexual Activity   Alcohol use: No   Drug use: Never   Sexual activity: Never    Birth  control/protection: Surgical, Post-menopausal  Other Topics Concern   Not on file  Social History Narrative   Lives alone. Enjoys reading, doing yard work, church activities & silver sneakers.   Social Determinants of Health   Financial Resource Strain: Low Risk  (07/13/2023)   Overall Financial Resource Strain (CARDIA)    Difficulty of Paying Living Expenses: Not hard at all  Food Insecurity: No Food Insecurity (07/13/2023)   Hunger Vital Sign    Worried About Running Out of Food in the Last Year: Never true    Ran Out of Food in the Last Year: Never true  Transportation Needs: No Transportation Needs (07/13/2023)   PRAPARE - Administrator, Civil Service (Medical): No    Lack of Transportation (Non-Medical): No  Physical Activity: Sufficiently Active (07/13/2023)   Exercise Vital Sign    Days of Exercise per Week: 3 days    Minutes of Exercise per Session: 50 min  Stress: No Stress Concern Present (07/13/2023)   [employer] of Occupational Health - Occupational Stress Questionnaire    Feeling of Stress : Not at all  Social Connections: Moderately Integrated (07/13/2023)   Social Connection and Isolation Panel [NHANES]    Frequency of Communication with Friends and Family: More than three times a week    Frequency of Social Gatherings with Friends and Family: More  than three times a week    Attends Religious Services: More than 4 times per year    Active Member of Clubs or Organizations: Yes    Attends Banker Meetings: More than 4 times per year    Marital Status: Divorced    Family History  Problem Relation Age of Onset   Heart attack Mother    Hypertension Mother    Diabetes Mother    Cancer Father        Prostate cancer   Heart disease Father    Hypertension Brother     Health Maintenance  Topic Date Due   HEMOGLOBIN A1C  08/26/2023   Diabetic kidney evaluation - eGFR measurement  09/18/2023   Diabetic kidney evaluation - Urine ACR   09/18/2023   Zoster Vaccines- Shingrix (1 of 2) 10/13/2023 (Originally 03/29/1973)   OPHTHALMOLOGY EXAM  10/26/2023 (Originally 07/11/2023)   COVID-19 Vaccine (3 - Pfizer risk series) 11/11/2023 (Originally 06/30/2020)   Pneumonia Vaccine 22+ Years old (1 of 1 - PCV) 02/23/2024 (Originally 03/30/2019)   INFLUENZA VACCINE  03/21/2024 (Originally 07/23/2023)   Colonoscopy  07/12/2024 (Originally 04/10/2014)   Hepatitis C Screening  07/12/2024 (Originally 03/29/1972)   Medicare Annual Wellness (AWV)  07/12/2024   DEXA SCAN  07/17/2024   FOOT EXAM  08/25/2024   MAMMOGRAM  08/05/2025   DTaP/Tdap/Td (2 - Td or Tdap) 12/22/2026   HPV VACCINES  Aged Out     ----------------------------------------------------------------------------------------------------------------------------------------------------------------------------------------------------------------- Physical Exam BP 101/67 (BP Location: Left Arm, Patient Position: Sitting, Cuff Size: Large)   Pulse 82   Ht 5' 1.75" (1.568 m)   Wt 210 lb (95.3 kg)   SpO2 100%   BMI 38.72 kg/m   Physical Exam Constitutional:      Appearance: Normal appearance.  Cardiovascular:     Rate and Rhythm: Normal rate and regular rhythm.  Pulmonary:     Effort: Pulmonary effort is normal.     Breath sounds: Normal breath sounds.  Musculoskeletal:     Cervical back: Neck supple.  Neurological:     Mental Status: She is alert.  Psychiatric:        Mood and Affect: Mood normal.        Behavior: Behavior normal.     ------------------------------------------------------------------------------------------------------------------------------------------------------------------------------------------------------------------- Assessment and Plan  Essential hypertension BP is well controlled. Recommend that she continue telmisartan/hctz at current strength.    Hyperlipidemia associated with type 2 diabetes mellitus (HCC) Continue atorvastatin at current  strength. Will plan to update lipid panel at annual exam in November.   Type 2 diabetes mellitus with obesity (HCC) She is working on dietary changes.  Encouraged continuation.  Continue current medications.     No orders of the defined types were placed in this encounter.   Return in about 3 months (around 11/25/2023) for Annual exam.    This visit occurred during the SARS-CoV-2 public health emergency.  Safety protocols were in place, including screening questions prior to the visit, additional usage of staff PPE, and extensive cleaning of exam room while observing appropriate contact time as indicated for disinfecting solutions.

## 2023-08-26 NOTE — Assessment & Plan Note (Signed)
She is working on dietary changes.  Encouraged continuation.  Continue current medications.

## 2023-08-26 NOTE — Progress Notes (Signed)
Faxed COC to [facility] for diabetic eye exam

## 2023-08-26 NOTE — Assessment & Plan Note (Signed)
Continue atorvastatin at current strength. Will plan to update lipid panel at annual exam in November.

## 2023-09-10 ENCOUNTER — Encounter: Payer: Self-pay | Admitting: Family Medicine

## 2023-11-12 ENCOUNTER — Other Ambulatory Visit: Payer: Self-pay | Admitting: Family Medicine

## 2023-11-12 DIAGNOSIS — I10 Essential (primary) hypertension: Secondary | ICD-10-CM

## 2023-11-12 DIAGNOSIS — E1169 Type 2 diabetes mellitus with other specified complication: Secondary | ICD-10-CM

## 2023-11-16 DIAGNOSIS — E785 Hyperlipidemia, unspecified: Secondary | ICD-10-CM | POA: Diagnosis not present

## 2023-11-16 DIAGNOSIS — E1169 Type 2 diabetes mellitus with other specified complication: Secondary | ICD-10-CM | POA: Diagnosis not present

## 2023-11-16 DIAGNOSIS — E669 Obesity, unspecified: Secondary | ICD-10-CM | POA: Diagnosis not present

## 2023-11-16 DIAGNOSIS — I10 Essential (primary) hypertension: Secondary | ICD-10-CM | POA: Diagnosis not present

## 2023-11-17 LAB — CMP14+EGFR
ALT: 17 [IU]/L (ref 0–32)
AST: 18 [IU]/L (ref 0–40)
Albumin: 4.4 g/dL (ref 3.9–4.9)
Alkaline Phosphatase: 64 [IU]/L (ref 44–121)
BUN/Creatinine Ratio: 28 (ref 12–28)
BUN: 23 mg/dL (ref 8–27)
Bilirubin Total: 0.5 mg/dL (ref 0.0–1.2)
CO2: 22 mmol/L (ref 20–29)
Calcium: 9.2 mg/dL (ref 8.7–10.3)
Chloride: 95 mmol/L — ABNORMAL LOW (ref 96–106)
Creatinine, Ser: 0.81 mg/dL (ref 0.57–1.00)
Globulin, Total: 2.6 g/dL (ref 1.5–4.5)
Glucose: 142 mg/dL — ABNORMAL HIGH (ref 70–99)
Potassium: 4.3 mmol/L (ref 3.5–5.2)
Sodium: 133 mmol/L — ABNORMAL LOW (ref 134–144)
Total Protein: 7 g/dL (ref 6.0–8.5)
eGFR: 79 mL/min/{1.73_m2} (ref >59)

## 2023-11-17 LAB — CBC WITH DIFFERENTIAL/PLATELET
Basophils Absolute: 0.1 10*3/uL (ref 0.0–0.2)
Basos: 1 %
EOS (ABSOLUTE): 0.1 10*3/uL (ref 0.0–0.4)
Eos: 2 %
Hematocrit: 37.7 % (ref 34.0–46.6)
Hemoglobin: 11.5 g/dL (ref 11.1–15.9)
Immature Grans (Abs): 0 10*3/uL (ref 0.0–0.1)
Immature Granulocytes: 0 %
Lymphocytes Absolute: 2.2 10*3/uL (ref 0.7–3.1)
Lymphs: 31 %
MCH: 25.3 pg — ABNORMAL LOW (ref 26.6–33.0)
MCHC: 30.5 g/dL — ABNORMAL LOW (ref 31.5–35.7)
MCV: 83 fL (ref 79–97)
Monocytes Absolute: 0.6 10*3/uL (ref 0.1–0.9)
Monocytes: 9 %
Neutrophils Absolute: 3.9 10*3/uL (ref 1.4–7.0)
Neutrophils: 57 %
Platelets: 334 10*3/uL (ref 150–450)
RBC: 4.55 x10E6/uL (ref 3.77–5.28)
RDW: 14.5 % (ref 11.7–15.4)
WBC: 6.9 10*3/uL (ref 3.4–10.8)

## 2023-11-17 LAB — LIPID PANEL WITH LDL/HDL RATIO
Cholesterol, Total: 151 mg/dL (ref 100–199)
HDL: 60 mg/dL (ref >39)
LDL Chol Calc (NIH): 68 mg/dL (ref 0–99)
LDL/HDL Ratio: 1.1 {ratio} (ref 0.0–3.2)
Triglycerides: 130 mg/dL (ref 0–149)
VLDL Cholesterol Cal: 23 mg/dL (ref 5–40)

## 2023-11-17 LAB — HEMOGLOBIN A1C
Est. average glucose Bld gHb Est-mCnc: 174 mg/dL
Hgb A1c MFr Bld: 7.7 % — ABNORMAL HIGH (ref 4.8–5.6)

## 2023-11-17 LAB — MICROALBUMIN / CREATININE URINE RATIO
Creatinine, Urine: 16 mg/dL
Microalb/Creat Ratio: <19 mg/g{creat} (ref 0–29)
Microalbumin, Urine: <3 ug/mL

## 2023-11-25 ENCOUNTER — Encounter: Payer: Self-pay | Admitting: Family Medicine

## 2023-11-25 ENCOUNTER — Ambulatory Visit (INDEPENDENT_AMBULATORY_CARE_PROVIDER_SITE_OTHER): Payer: Medicare HMO | Admitting: Family Medicine

## 2023-11-25 VITALS — BP 109/71 | HR 90 | Ht 61.75 in | Wt 210.0 lb

## 2023-11-25 DIAGNOSIS — Z0000 Encounter for general adult medical examination without abnormal findings: Secondary | ICD-10-CM | POA: Diagnosis not present

## 2023-11-25 NOTE — Patient Instructions (Signed)
Preventive Care 65 Years and Older, Female Preventive care refers to lifestyle choices and visits with your health care provider that can promote health and wellness. Preventive care visits are also called wellness exams. What can I expect for my preventive care visit? Counseling Your health care provider may ask you questions about your: Medical history, including: Past medical problems. Family medical history. Pregnancy and menstrual history. History of falls. Current health, including: Memory and ability to understand (cognition). Emotional well-being. Home life and relationship well-being. Sexual activity and sexual health. Lifestyle, including: Alcohol, nicotine or tobacco, and drug use. Access to firearms. Diet, exercise, and sleep habits. Work and work environment. Sunscreen use. Safety issues such as seatbelt and bike helmet use. Physical exam Your health care provider will check your: Height and weight. These may be used to calculate your BMI (body mass index). BMI is a measurement that tells if you are at a healthy weight. Waist circumference. This measures the distance around your waistline. This measurement also tells if you are at a healthy weight and may help predict your risk of certain diseases, such as type 2 diabetes and high blood pressure. Heart rate and blood pressure. Body temperature. Skin for abnormal spots. What immunizations do I need?  Vaccines are usually given at various ages, according to a schedule. Your health care provider will recommend vaccines for you based on your age, medical history, and lifestyle or other factors, such as travel or where you work. What tests do I need? Screening Your health care provider may recommend screening tests for certain conditions. This may include: Lipid and cholesterol levels. Hepatitis C test. Hepatitis B test. HIV (human immunodeficiency virus) test. STI (sexually transmitted infection) testing, if you are at  risk. Lung cancer screening. Colorectal cancer screening. Diabetes screening. This is done by checking your blood sugar (glucose) after you have not eaten for a while (fasting). Mammogram. Talk with your health care provider about how often you should have regular mammograms. BRCA-related cancer screening. This may be done if you have a family history of breast, ovarian, tubal, or peritoneal cancers. Bone density scan. This is done to screen for osteoporosis. Talk with your health care provider about your test results, treatment options, and if necessary, the need for more tests. Follow these instructions at home: Eating and drinking  Eat a diet that includes fresh fruits and vegetables, whole grains, lean protein, and low-fat dairy products. Limit your intake of foods with high amounts of sugar, saturated fats, and salt. Take vitamin and mineral supplements as recommended by your health care provider. Do not drink alcohol if your health care provider tells you not to drink. If you drink alcohol: Limit how much you have to 0-1 drink a day. Know how much alcohol is in your drink. In the [place] one drink equals one 12 oz bottle of beer (355 mL), one 5 oz glass of wine (148 mL), or one 1 oz glass of hard liquor (44 mL). Lifestyle Brush your teeth every morning and night with fluoride toothpaste. Floss one time each day. Exercise for at least 30 minutes 5 or more days each week. Do not use any products that contain nicotine or tobacco. These products include cigarettes, chewing tobacco, and vaping devices, such as e-cigarettes. If you need help quitting, ask your health care provider. Do not use drugs. If you are sexually active, practice safe sex. Use a condom or other form of protection in order to prevent STIs. Take aspirin only as told by   your health care provider. Make sure that you understand how much to take and what form to take. Work with your health care provider to find out whether it  is safe and beneficial for you to take aspirin daily. Ask your health care provider if you need to take a cholesterol-lowering medicine (statin). Find healthy ways to manage stress, such as: Meditation, yoga, or listening to music. Journaling. Talking to a trusted person. Spending time with friends and family. Minimize exposure to UV radiation to reduce your risk of skin cancer. Safety Always wear your seat belt while driving or riding in a vehicle. Do not drive: If you have been drinking alcohol. Do not ride with someone who has been drinking. When you are tired or distracted. While texting. If you have been using any mind-altering substances or drugs. Wear a helmet and other protective equipment during sports activities. If you have firearms in your house, make sure you follow all gun safety procedures. What's next? Visit your health care provider once a year for an annual wellness visit. Ask your health care provider how often you should have your eyes and teeth checked. Stay up to date on all vaccines. This information is not intended to replace advice given to you by your health care provider. Make sure you discuss any questions you have with your health care provider. Document Revised: 06/05/2021 Document Reviewed: 06/05/2021 Elsevier Patient Education  2024 [employer]  

## 2023-11-25 NOTE — Assessment & Plan Note (Signed)
Well adult Recent labs reviewed. Screenings: Colon cancer screening recommended however she is not interested in having this done.  She understands risks of not undergoing screening. Immunizations: Declines all recommended immunizations including pneumonia, influenza and Shingrix vaccine.

## 2023-11-25 NOTE — Progress Notes (Signed)
Mary [name] - 67 y.o. female MRN [id number]  Date of birth: Jul 02, 1956  Subjective Chief Complaint  Patient presents with   Annual Exam    HPI Mary [name] is a 67 y.o. female here today for annual exam.   She had labs completed prior to visit which we reviewed in detail.  Her A1c has increased further since last visit. LDL remains well controlled with atorvastatin.  BP remains well controlled.   She admits that diet could be better.  Moderate activity level  She is a non-smoker.  Denies EtOH use.   Review of Systems  Constitutional:  Negative for chills, fever, malaise/fatigue and weight loss.  HENT:  Negative for congestion, ear pain and sore throat.   Eyes:  Negative for blurred vision, double vision and pain.  Respiratory:  Negative for cough and shortness of breath.   Cardiovascular:  Negative for chest pain and palpitations.  Gastrointestinal:  Negative for abdominal pain, blood in stool, constipation, heartburn and nausea.  Genitourinary:  Negative for dysuria and urgency.  Musculoskeletal:  Negative for joint pain and myalgias.  Neurological:  Negative for dizziness and headaches.  Endo/Heme/Allergies:  Does not bruise/bleed easily.  Psychiatric/Behavioral:  Negative for depression. The patient is not nervous/anxious and does not have insomnia.     Allergies  Allergen Reactions   Codeine Nausea And Vomiting    Past Medical History:  Diagnosis Date   Breast cancer (HCC) 10/2008   Left breast invasive ductal carcinoma   Diabetes mellitus without complication (HCC)    GERD (gastroesophageal reflux disease)    Hyperlipidemia    Hypertension    Osteopenia     Past Surgical History:  Procedure Laterality Date   ABDOMINAL HYSTERECTOMY  11/1998   BREAST LUMPECTOMY WITH AXILLARY LYMPH NODE BIOPSY Left    Lumpectomy in 11/2008 and axillary biopsy in 12/2008   DILATION AND CURETTAGE OF UTERUS  08/1998    Social History   Socioeconomic History   Marital status:  Divorced    Spouse name: Not on file   Number of children: 0   Years of education: 13   Highest education level: Some college, no degree  Occupational History    Comment: Retired  Tobacco Use   Smoking status: Former   Smokeless tobacco: Never   Tobacco comments:    Quit smoking in 1990  Vaping Use   Vaping status: Never Used  Substance and Sexual Activity   Alcohol use: No   Drug use: Never   Sexual activity: Never    Birth control/protection: Surgical, Post-menopausal  Other Topics Concern   Not on file  Social History Narrative   Lives alone. Enjoys reading, doing yard work, church activities & silver sneakers.   Social Determinants of Health   Financial Resource Strain: Low Risk  (07/13/2023)   Overall Financial Resource Strain (CARDIA)    Difficulty of Paying Living Expenses: Not hard at all  Food Insecurity: No Food Insecurity (07/13/2023)   Hunger Vital Sign    Worried About Running Out of Food in the Last Year: Never true    Ran Out of Food in the Last Year: Never true  Transportation Needs: No Transportation Needs (07/13/2023)   PRAPARE - Administrator, Civil Service (Medical): No    Lack of Transportation (Non-Medical): No  Physical Activity: Sufficiently Active (07/13/2023)   Exercise Vital Sign    Days of Exercise per Week: 3 days    Minutes of Exercise per Session: 50 min  Stress: No Stress Concern Present (07/13/2023)   [employer] of Occupational Health - Occupational Stress Questionnaire    Feeling of Stress : Not at all  Social Connections: Moderately Integrated (07/13/2023)   Social Connection and Isolation Panel [NHANES]    Frequency of Communication with Friends and Family: More than three times a week    Frequency of Social Gatherings with Friends and Family: More than three times a week    Attends Religious Services: More than 4 times per year    Active Member of Clubs or Organizations: Yes    Attends Engineer, structural:  More than 4 times per year    Marital Status: Divorced    Family History  Problem Relation Age of Onset   Heart attack Mother    Hypertension Mother    Diabetes Mother    Cancer Father        Prostate cancer   Heart disease Father    Hypertension Brother     Health Maintenance  Topic Date Due   Zoster Vaccines- Shingrix (1 of 2) Never done   COVID-19 Vaccine (3 - Pfizer risk series) 06/30/2020   Pneumonia Vaccine 67+ Years old (1 of 1 - PCV) 02/23/2024 (Originally 03/30/2019)   INFLUENZA VACCINE  03/21/2024 (Originally 07/23/2023)   Colonoscopy  07/12/2024 (Originally 04/10/2014)   Hepatitis C Screening  07/12/2024 (Originally 03/29/1972)   HEMOGLOBIN A1C  05/15/2024   Medicare Annual Wellness (AWV)  07/12/2024   DEXA SCAN  07/17/2024   OPHTHALMOLOGY EXAM  08/11/2024   FOOT EXAM  08/25/2024   Diabetic kidney evaluation - eGFR measurement  11/15/2024   Diabetic kidney evaluation - Urine ACR  11/15/2024   MAMMOGRAM  08/05/2025   DTaP/Tdap/Td (2 - Td or Tdap) 12/22/2026   HPV VACCINES  Aged Out     ----------------------------------------------------------------------------------------------------------------------------------------------------------------------------------------------------------------- Physical Exam BP 109/71 (BP Location: Left Arm, Patient Position: Sitting, Cuff Size: Normal)   Pulse 90   Ht 5' 1.75" (1.568 m)   Wt 210 lb (95.3 kg)   SpO2 98%   BMI 38.72 kg/m   Physical Exam Constitutional:      General: She is not in acute distress. HENT:     Head: Normocephalic and atraumatic.     Right Ear: Tympanic membrane and ear canal normal.     Left Ear: Tympanic membrane and ear canal normal.     Nose: Nose normal.  Eyes:     General: No scleral icterus.    Conjunctiva/sclera: Conjunctivae normal.  Neck:     Thyroid: No thyromegaly.  Cardiovascular:     Rate and Rhythm: Normal rate and regular rhythm.     Heart sounds: Normal heart sounds.  Pulmonary:      Effort: Pulmonary effort is normal.     Breath sounds: Normal breath sounds.  Abdominal:     General: Bowel sounds are normal. There is no distension.     Palpations: Abdomen is soft.     Tenderness: There is no abdominal tenderness. There is no guarding.  Musculoskeletal:        General: Normal range of motion.     Cervical back: Normal range of motion and neck supple.  Lymphadenopathy:     Cervical: No cervical adenopathy.  Skin:    General: Skin is warm and dry.     Findings: No rash.  Neurological:     General: No focal deficit present.     Mental Status: She is alert and oriented to person, place, and time.  Cranial Nerves: No cranial nerve deficit.     Coordination: Coordination normal.  Psychiatric:        Mood and Affect: Mood normal.        Behavior: Behavior normal.     ------------------------------------------------------------------------------------------------------------------------------------------------------------------------------------------------------------------- Assessment and Plan  Well adult exam Well adult Recent labs reviewed. Screenings: Colon cancer screening recommended however she is not interested in having this done.  She understands risks of not undergoing screening. Immunizations: Declines all recommended immunizations including pneumonia, influenza and Shingrix vaccine.   No orders of the defined types were placed in this encounter.   Return in about 3 months (around 02/23/2024) for Type 2 Diabetes.    This visit occurred during the SARS-CoV-2 public health emergency.  Safety protocols were in place, including screening questions prior to the visit, additional usage of staff PPE, and extensive cleaning of exam room while observing appropriate contact time as indicated for disinfecting solutions.

## 2023-12-03 ENCOUNTER — Other Ambulatory Visit: Payer: Self-pay | Admitting: Family Medicine

## 2023-12-03 DIAGNOSIS — E119 Type 2 diabetes mellitus without complications: Secondary | ICD-10-CM

## 2023-12-08 ENCOUNTER — Other Ambulatory Visit: Payer: Self-pay | Admitting: Family Medicine

## 2024-02-09 ENCOUNTER — Other Ambulatory Visit: Payer: Self-pay | Admitting: Family Medicine

## 2024-02-09 DIAGNOSIS — E1169 Type 2 diabetes mellitus with other specified complication: Secondary | ICD-10-CM

## 2024-02-26 ENCOUNTER — Encounter: Payer: Self-pay | Admitting: Family Medicine

## 2024-02-26 ENCOUNTER — Ambulatory Visit (INDEPENDENT_AMBULATORY_CARE_PROVIDER_SITE_OTHER): Payer: Medicare HMO | Admitting: Family Medicine

## 2024-02-26 VITALS — BP 114/71 | HR 99 | Ht 61.75 in | Wt 199.0 lb

## 2024-02-26 DIAGNOSIS — I10 Essential (primary) hypertension: Secondary | ICD-10-CM

## 2024-02-26 DIAGNOSIS — E1169 Type 2 diabetes mellitus with other specified complication: Secondary | ICD-10-CM

## 2024-02-26 DIAGNOSIS — Z7984 Long term (current) use of oral hypoglycemic drugs: Secondary | ICD-10-CM

## 2024-02-26 DIAGNOSIS — E669 Obesity, unspecified: Secondary | ICD-10-CM

## 2024-02-26 LAB — POCT GLYCOSYLATED HEMOGLOBIN (HGB A1C): HbA1c, POC (controlled diabetic range): 6.8 % (ref 0.0–7.0)

## 2024-02-26 NOTE — Assessment & Plan Note (Signed)
BP is well controlled. Recommend that she continue telmisartan/hctz at current strength.

## 2024-02-26 NOTE — Assessment & Plan Note (Signed)
 Continue atorvastatin at current strength.  Lab Results  Component Value Date   LDLCALC 68 11/16/2023

## 2024-02-26 NOTE — Assessment & Plan Note (Addendum)
 Diabetes is much better controlled.  She had several questions regarding diet and monitoring of blood sugar today which I answered.  Encouraged continuation of dietary changes and increased activity.

## 2024-02-26 NOTE — Progress Notes (Signed)
 Mary [name] - 68 y.o. female MRN [id number]  Date of birth: [birth year]  Subjective Chief Complaint  Patient presents with   Medical Management of Chronic Issues    HPI Mary [name] is a 68 y.o. female here today for follow up visit.   She reports that she is doing well.   She continues on actos, metformin, jardiance and glimepiride for management of her diabetes.  Tolerating medications well at current strength. She denies hypoglycemia.  A1c today is improved to 6.8%.  Tolerating atorvastatin well for associated HLD.  She continues on telmisartan/hydrochlorothiazide for management of HTN.  Her BP is well controlled at this time.  She denies chest pain, shortness of breath, palpitations, headache or vision changes.    ROS:  A comprehensive ROS was completed and negative except as noted per HPI  Allergies  Allergen Reactions   Codeine Nausea And Vomiting    Past Medical History:  Diagnosis Date   Breast cancer (HCC) 10/2008   Left breast invasive ductal carcinoma   Diabetes mellitus without complication (HCC)    GERD (gastroesophageal reflux disease)    Hyperlipidemia    Hypertension    Osteopenia     Past Surgical History:  Procedure Laterality Date   ABDOMINAL HYSTERECTOMY  11/1998   BREAST LUMPECTOMY WITH AXILLARY LYMPH NODE BIOPSY Left    Lumpectomy in 11/2008 and axillary biopsy in 12/2008   DILATION AND CURETTAGE OF UTERUS  08/1998    Social History   Socioeconomic History   Marital status: Divorced    Spouse name: Not on file   Number of children: 0   Years of education: 13   Highest education level: Some college, no degree  Occupational History    Comment: Retired  Tobacco Use   Smoking status: Former   Smokeless tobacco: Never   Tobacco comments:    Quit smoking in 1990  Vaping Use   Vaping status: Never Used  Substance and Sexual Activity   Alcohol use: No   Drug use: Never   Sexual activity: Never    Birth control/protection: Surgical,  Post-menopausal  Other Topics Concern   Not on file  Social History Narrative   Lives alone. Enjoys reading, doing yard work, church activities & silver sneakers.   Social Drivers of Corporate investment banker Strain: Low Risk  (07/13/2023)   Overall Financial Resource Strain (CARDIA)    Difficulty of Paying Living Expenses: Not hard at all  Food Insecurity: No Food Insecurity (07/13/2023)   Hunger Vital Sign    Worried About Running Out of Food in the Last Year: Never true    Ran Out of Food in the Last Year: Never true  Transportation Needs: No Transportation Needs (07/13/2023)   PRAPARE - Administrator, Civil Service (Medical): No    Lack of Transportation (Non-Medical): No  Physical Activity: Sufficiently Active (07/13/2023)   Exercise Vital Sign    Days of Exercise per Week: 3 days    Minutes of Exercise per Session: 50 min  Stress: No Stress Concern Present (07/13/2023)   [employer] of Occupational Health - Occupational Stress Questionnaire    Feeling of Stress : Not at all  Social Connections: Moderately Integrated (07/13/2023)   Social Connection and Isolation Panel [NHANES]    Frequency of Communication with Friends and Family: More than three times a week    Frequency of Social Gatherings with Friends and Family: More than three times a week    Attends Religious Services:  More than 4 times per year    Active Member of Clubs or Organizations: Yes    Attends Banker Meetings: More than 4 times per year    Marital Status: Divorced    Family History  Problem Relation Age of Onset   Heart attack Mother    Hypertension Mother    Diabetes Mother    Cancer Father        Prostate cancer   Heart disease Father    Hypertension Brother     Health Maintenance  Topic Date Due   INFLUENZA VACCINE  03/21/2024 (Originally 07/23/2023)   Colonoscopy  07/12/2024 (Originally 04/10/2014)   Hepatitis C Screening  07/12/2024 (Originally 03/29/1972)    COVID-19 Vaccine (3 - Pfizer risk series) 08/28/2024 (Originally 06/30/2020)   Zoster Vaccines- Shingrix (1 of 2) 08/28/2024 (Originally 03/29/1973)   Pneumonia Vaccine 37+ Years old (1 of 2 - PCV) 08/28/2025 (Originally 03/29/1960)   HEMOGLOBIN A1C  05/15/2024   Medicare Annual Wellness (AWV)  07/12/2024   DEXA SCAN  07/17/2024   OPHTHALMOLOGY EXAM  08/11/2024   FOOT EXAM  08/25/2024   Diabetic kidney evaluation - eGFR measurement  11/15/2024   Diabetic kidney evaluation - Urine ACR  11/15/2024   MAMMOGRAM  08/05/2025   DTaP/Tdap/Td (2 - Td or Tdap) 12/22/2026   HPV VACCINES  Aged Out     ----------------------------------------------------------------------------------------------------------------------------------------------------------------------------------------------------------------- Physical Exam BP 114/71 (BP Location: Left Arm, Patient Position: Sitting, Cuff Size: Normal)   Pulse 99   Ht 5' 1.75" (1.568 m)   Wt 199 lb (90.3 kg)   SpO2 100%   BMI 36.69 kg/m   Physical Exam Constitutional:      Appearance: Normal appearance.  HENT:     Head: Normocephalic and atraumatic.  Eyes:     General: No scleral icterus. Cardiovascular:     Rate and Rhythm: Normal rate and regular rhythm.  Pulmonary:     Effort: Pulmonary effort is normal.     Breath sounds: Normal breath sounds.  Neurological:     Mental Status: She is alert.  Psychiatric:        Mood and Affect: Mood normal.        Behavior: Behavior normal.     ------------------------------------------------------------------------------------------------------------------------------------------------------------------------------------------------------------------- Assessment and Plan  Hyperlipidemia associated with type 2 diabetes mellitus (HCC) Continue atorvastatin at current strength.  Lab Results  Component Value Date   LDLCALC 68 11/16/2023     Essential hypertension BP is well controlled.  Recommend that she continue telmisartan/hctz at current strength.    Type 2 diabetes mellitus with obesity (HCC) Diabetes is much better controlled.  She had several questions regarding diet and monitoring of blood sugar today which I answered.  Encouraged continuation of dietary changes and increased activity.   No orders of the defined types were placed in this encounter.   Return in about 4 months (around 06/27/2024) for Type 2 Diabetes, Hypertension.  Time Statement:  35 minutes spent including pre visit preparation, review of prior notes and labs, encounter with patient and same day documentation.   This visit occurred during the SARS-CoV-2 public health emergency.  Safety protocols were in place, including screening questions prior to the visit, additional usage of staff PPE, and extensive cleaning of exam room while observing appropriate contact time as indicated for disinfecting solutions.

## 2024-04-14 ENCOUNTER — Other Ambulatory Visit: Payer: Self-pay | Admitting: Family Medicine

## 2024-04-14 DIAGNOSIS — E119 Type 2 diabetes mellitus without complications: Secondary | ICD-10-CM

## 2024-05-08 ENCOUNTER — Other Ambulatory Visit: Payer: Self-pay | Admitting: Family Medicine

## 2024-05-08 DIAGNOSIS — E119 Type 2 diabetes mellitus without complications: Secondary | ICD-10-CM

## 2024-05-08 DIAGNOSIS — E1169 Type 2 diabetes mellitus with other specified complication: Secondary | ICD-10-CM

## 2024-05-08 DIAGNOSIS — I10 Essential (primary) hypertension: Secondary | ICD-10-CM

## 2024-06-02 ENCOUNTER — Other Ambulatory Visit: Payer: Self-pay | Admitting: Family Medicine

## 2024-06-02 DIAGNOSIS — E119 Type 2 diabetes mellitus without complications: Secondary | ICD-10-CM

## 2024-06-21 ENCOUNTER — Encounter: Payer: Self-pay | Admitting: Family Medicine

## 2024-06-21 ENCOUNTER — Ambulatory Visit (INDEPENDENT_AMBULATORY_CARE_PROVIDER_SITE_OTHER): Admitting: Family Medicine

## 2024-06-21 VITALS — BP 109/74 | HR 160 | Ht 61.75 in | Wt 199.0 lb

## 2024-06-21 DIAGNOSIS — E669 Obesity, unspecified: Secondary | ICD-10-CM

## 2024-06-21 DIAGNOSIS — R000 Tachycardia, unspecified: Secondary | ICD-10-CM

## 2024-06-21 DIAGNOSIS — I471 Supraventricular tachycardia, unspecified: Secondary | ICD-10-CM | POA: Diagnosis not present

## 2024-06-21 DIAGNOSIS — I10 Essential (primary) hypertension: Secondary | ICD-10-CM | POA: Diagnosis not present

## 2024-06-21 DIAGNOSIS — E785 Hyperlipidemia, unspecified: Secondary | ICD-10-CM | POA: Diagnosis not present

## 2024-06-21 DIAGNOSIS — E1169 Type 2 diabetes mellitus with other specified complication: Secondary | ICD-10-CM

## 2024-06-21 DIAGNOSIS — Z7984 Long term (current) use of oral hypoglycemic drugs: Secondary | ICD-10-CM

## 2024-06-21 LAB — POCT GLYCOSYLATED HEMOGLOBIN (HGB A1C): HbA1c, POC (controlled diabetic range): 6.5 % (ref 0.0–7.0)

## 2024-06-21 NOTE — Addendum Note (Signed)
 Addended by: BONNY JON DEL on: 06/21/2024 10:10 AM   Modules accepted: Orders

## 2024-06-21 NOTE — Assessment & Plan Note (Signed)
BP is well controlled. Recommend that she continue telmisartan/hctz at current strength.

## 2024-06-21 NOTE — Assessment & Plan Note (Signed)
 SVT with rate of 150 noted on EKG.  No P waves present on EKG.  Improved to rate of 85 after valsalva.  Discussed recommendations including avoiding caffeine and vigorous exercise for now.  She refuses beta blocker at this time.  Checking electrolytes and TSH today.  Referral placed to cardiology.  Red flags reviewed.

## 2024-06-21 NOTE — Progress Notes (Addendum)
 Mary [name] - 68 y.o. female MRN [id number]  Date of birth: Jan 18, 1956  Subjective Chief Complaint  Patient presents with   Diabetes    HPI Mary [name] is a 68 y.o. female here today for follow up visit.   She reports that she is doing ok.   She continues on Actos , Amaryl  and Jardiance  for management of T2DM.  She is tolerating medications well. A1c today is 6.5%.   Exercising at [employer] regularly.  Remains on atorvastatin  for management of associated HLD.   BP is managed with telmisartan /hydrochlorothiazide.  BP is well controlled at current strength of medication. HR elevated today.  She denies symptoms at this time.   She has not had chest pain, shortness of breath, palpitations, headache or vision changes.   ROS:  A comprehensive ROS was completed and negative except as noted per HPI  Allergies  Allergen Reactions   Codeine Nausea And Vomiting    Past Medical History:  Diagnosis Date   Breast cancer (HCC) 10/2008   Left breast invasive ductal carcinoma   Diabetes mellitus without complication (HCC)    GERD (gastroesophageal reflux disease)    Hyperlipidemia    Hypertension    Osteopenia     Past Surgical History:  Procedure Laterality Date   ABDOMINAL HYSTERECTOMY  11/1998   BREAST LUMPECTOMY WITH AXILLARY LYMPH NODE BIOPSY Left    Lumpectomy in 11/2008 and axillary biopsy in 12/2008   DILATION AND CURETTAGE OF UTERUS  08/1998    Social History   Socioeconomic History   Marital status: Divorced    Spouse name: Not on file   Number of children: 0   Years of education: 13   Highest education level: Some college, no degree  Occupational History    Comment: Retired  Tobacco Use   Smoking status: Former   Smokeless tobacco: Never   Tobacco comments:    Quit smoking in 1990  Vaping Use   Vaping status: Never Used  Substance and Sexual Activity   Alcohol use: No   Drug use: Never   Sexual activity: Never    Birth control/protection: Surgical,  Post-menopausal  Other Topics Concern   Not on file  Social History Narrative   Lives alone. Enjoys reading, doing yard work, church activities & silver sneakers.   Social Drivers of Corporate investment banker Strain: Low Risk  (06/21/2024)   Overall Financial Resource Strain (CARDIA)    Difficulty of Paying Living Expenses: Not hard at all  Food Insecurity: No Food Insecurity (06/21/2024)   Hunger Vital Sign    Worried About Running Out of Food in the Last Year: Never true    Ran Out of Food in the Last Year: Never true  Transportation Needs: No Transportation Needs (06/21/2024)   PRAPARE - Administrator, Civil Service (Medical): No    Lack of Transportation (Non-Medical): No  Physical Activity: Sufficiently Active (06/21/2024)   Exercise Vital Sign    Days of Exercise per Week: 3 days    Minutes of Exercise per Session: 50 min  Stress: No Stress Concern Present (06/21/2024)   [employer] of Occupational Health - Occupational Stress Questionnaire    Feeling of Stress: Not at all  Social Connections: Moderately Integrated (06/21/2024)   Social Connection and Isolation Panel    Frequency of Communication with Friends and Family: Three times a week    Frequency of Social Gatherings with Friends and Family: Three times a week    Attends Religious  Services: More than 4 times per year    Active Member of Clubs or Organizations: Yes    Attends Banker Meetings: More than 4 times per year    Marital Status: Divorced    Family History  Problem Relation Age of Onset   Heart attack Mother    Hypertension Mother    Diabetes Mother    Cancer Father        Prostate cancer   Heart disease Father    Hypertension Brother     Health Maintenance  Topic Date Due   Medicare Annual Wellness (AWV)  07/12/2024   Colonoscopy  07/12/2024 (Originally 04/10/2014)   Hepatitis C Screening  07/12/2024 (Originally 03/29/1972)   COVID-19 Vaccine (3 - Pfizer risk series)  08/28/2024 (Originally 06/30/2020)   Zoster Vaccines- Shingrix (1 of 2) 08/28/2024 (Originally 03/29/1973)   Pneumococcal Vaccine: 50+ Years (1 of 2 - PCV) 08/28/2025 (Originally 03/29/1973)   DEXA SCAN  07/17/2024   INFLUENZA VACCINE  07/22/2024   OPHTHALMOLOGY EXAM  08/11/2024   FOOT EXAM  08/25/2024   Diabetic kidney evaluation - eGFR measurement  11/15/2024   Diabetic kidney evaluation - Urine ACR  11/15/2024   HEMOGLOBIN A1C  12/22/2024   MAMMOGRAM  08/05/2025   DTaP/Tdap/Td (2 - Td or Tdap) 12/22/2026   Hepatitis B Vaccines  Aged Out   HPV VACCINES  Aged Out   Meningococcal B Vaccine  Aged Out     ----------------------------------------------------------------------------------------------------------------------------------------------------------------------------------------------------------------- Physical Exam BP 109/74 (BP Location: Left Arm, Patient Position: Sitting, Cuff Size: Normal)   Pulse (!) 160   Ht 5' 1.75 (1.568 m)   Wt 199 lb (90.3 kg)   SpO2 97%   BMI 36.69 kg/m   Physical Exam Constitutional:      Appearance: Normal appearance.  HENT:     Head: Normocephalic and atraumatic.   Cardiovascular:     Rate and Rhythm: Normal rate and regular rhythm.  Pulmonary:     Effort: Pulmonary effort is normal.     Breath sounds: Normal breath sounds.   Musculoskeletal:     Cervical back: Neck supple.   Neurological:     General: No focal deficit present.     Mental Status: She is alert.   Psychiatric:        Mood and Affect: Mood normal.        Behavior: Behavior normal.   EKG: SVT with rate of  154BPM.   ------------------------------------------------------------------------------------------------------------------------------------------------------------------------------------------------------------------- Assessment and Plan  Hyperlipidemia associated with type 2 diabetes mellitus (HCC) Continue atorvastatin  at current strength.  Lab Results   Component Value Date   LDLCALC 68 11/16/2023     Essential hypertension BP is well controlled. Recommend that she continue telmisartan /hctz at current strength.    SVT (supraventricular tachycardia) (HCC) SVT with rate of 150 noted on EKG.  No P waves present on EKG.  Improved to rate of 85 after valsalva.  Discussed recommendations including avoiding caffeine and vigorous exercise for now.  She refuses beta blocker at this time.  Checking electrolytes and TSH today.  Referral placed to cardiology.  Red flags reviewed.    No orders of the defined types were placed in this encounter.   Return in about 5 months (around 11/21/2024) for Annual Exam.   Billing: 65 minutes spent including pre visit preparation, review of prior notes and labs, encounter with patient and same day documentation.

## 2024-06-21 NOTE — Assessment & Plan Note (Signed)
 Continue atorvastatin at current strength.  Lab Results  Component Value Date   LDLCALC 68 11/16/2023

## 2024-06-21 NOTE — Patient Instructions (Signed)
 Pick up a pulseox meter to keep an eye on heart rate Avoid strenuous exercise    Supraventricular Tachycardia, Adult Supraventricular tachycardia (SVT) is a kind of abnormal heartbeat. It makes your heart beat very fast. This may last for just a short time. Or it may last longer and need treatment to get the heartbeat to go back to normal. A normal resting heartbeat is 60-100 times a minute. SVT can make your heart beat more than 150 times a minute.  The times when you have a fast heartbeat--or episodes of SVT--can be scary. But they're usually not dangerous. In some cases, SVT can lead to other heart problems. What are the causes?  SVT happens when electrical signals are sent out from areas of the upper part of the heart that don't normally send heartbeat signals. What increases the risk? You are more likely to get SVT if you are: Middle aged or older. Female. Other things that may increase your risk include: Having stress or feeling worried or nervous. Using illegal drugs such as cocaine or methamphetamine. Using over-the-counter cough or cold medicines. Stimulant drugs, such as caffeine. Smoking or alcohol use. Having any of these conditions: A thyroid condition. Diabetes. Obstructive sleep apnea. What are the signs or symptoms? A pounding heartbeat. Feeling fast or irregular heartbeats called palpitations. Shortness of breath. Weakness and tiredness. Tightness or pain in your chest. Feeling light-headed or dizzy. Feeling worried or nervous. Sometimes there are no symptoms. How is this diagnosed? This condition may be diagnosed based on: Your symptoms and a physical exam. Your health care provider will listen to your heart and feel your pulse. Tests. These may include: An electrocardiogram (ECG). This test checks for problems with electrical activity in the heart. A Holter monitor or event monitor test. For this test, you'll wear a device that monitors your heart rate over  time. An echocardiogram. This test uses sound waves to make a picture of your heart. A stress echocardiogram. An echocardiogram is done when you are at rest and after exercise. Blood tests. An electrophysiology study (EPS). This tests the electrical activity in your heart. It helps find where the abnormal heart rhythm is coming from. How is this treated? Treatment may include: Vagal nerve stimulation. Doing things to stimulate a nerve called the vagus nerve can slow down the heartbeat. Work with your provider to find which technique works best for you. Ways to do this include: Holding your breath and pushing, as though you are pooping. Putting gentle pressure on the neck. Do not try this yourself. Only a provider should do this. If done the wrong way, it can lead to a stroke. Medicines that prevent attacks. Medicine to stop an attack. This is given through an IV at the hospital. Cardioversion. This uses a small electric shock to stop an attack. Catheter ablation. This is a procedure to get rid of cells in the area that's causing the fast heartbeats. If you don't have symptoms, you may not need treatment. Follow these instructions at home: Stress Avoid things that make you feel stressed. Find healthy ways to deal with stress, such as: Doing yoga or meditation. Going out in nature. Listening to relaxing music. Taking steps to be healthy, such as getting lots of sleep, exercising, and eating a balanced diet. Talking with a mental health provider. Lifestyle Try to get at least 7 hours of sleep each night. Do not smoke, vape, or use products with nicotine or tobacco in them. If you need help quitting, talk  with your provider. Do not use illegal drugs. If you need help quitting, talk with your provider. Do not drink alcohol if it gives you a fast heartbeat. If alcohol does not seem to give you a fast heartbeat, limit your alcohol use. If you drink alcohol: Limit how much you have to: 0-1  drink a day if you are female. 0-2 drinks a day if you are female. Know how much alcohol is in your drink. In the [place] one drink is one 12 oz bottle of beer (355 mL), one 5 oz glass of wine (148 mL), or one 1 oz glass of hard liquor (44 mL). Be aware of how caffeine affects you. If caffeine gives you a fast heartbeat, do not eat, drink, or use anything with caffeine in it. If caffeine doesn't seem to give you a fast heartbeat, limit how much caffeine you have. General instructions Stay at a healthy weight. Exercise often. Ask your provider about good activities for you. Try one or a mixture of these: 150 minutes a week of gentle exercise, like walking or yoga. 75 minutes a week of exercise that is very active, like running or swimming. Do vagal nerve stimulation only as told by your provider. Take medicines only as told by your provider. Keep all follow-up visits. Your provider will want to make sure your treatments are working. Contact a health care provider if: You have a fast heartbeat more often. The feeling that your heart is beating fast lasts longer than before. Home treatments to slow down your heartbeat do not help. You have new symptoms. Get help right away if: You have chest pain. Your heart beats very fast for more than 20 minutes. You have trouble breathing. You faint. Your symptoms get worse. These symptoms may be an emergency. Call 911 right away. Do not wait to see if the symptoms will go away. Do not drive yourself to the hospital. This information is not intended to replace advice given to you by your health care provider. Make sure you discuss any questions you have with your health care provider. Document Revised: 09/10/2023 Document Reviewed: 01/27/2023 Elsevier Patient Education  2024 [employer].

## 2024-06-22 LAB — CBC WITH DIFFERENTIAL/PLATELET
Basophils Absolute: 0.1 10*3/uL (ref 0.0–0.2)
Basos: 1 %
EOS (ABSOLUTE): 0.1 10*3/uL (ref 0.0–0.4)
Eos: 2 %
Hematocrit: 38.7 % (ref 34.0–46.6)
Hemoglobin: 11.5 g/dL (ref 11.1–15.9)
Immature Grans (Abs): 0 10*3/uL (ref 0.0–0.1)
Immature Granulocytes: 0 %
Lymphocytes Absolute: 1.8 10*3/uL (ref 0.7–3.1)
Lymphs: 34 %
MCH: 25.1 pg — ABNORMAL LOW (ref 26.6–33.0)
MCHC: 29.7 g/dL — ABNORMAL LOW (ref 31.5–35.7)
MCV: 84 fL (ref 79–97)
Monocytes Absolute: 0.5 10*3/uL (ref 0.1–0.9)
Monocytes: 9 %
Neutrophils Absolute: 3 10*3/uL (ref 1.4–7.0)
Neutrophils: 54 %
Platelets: 318 10*3/uL (ref 150–450)
RBC: 4.59 x10E6/uL (ref 3.77–5.28)
RDW: 15.2 % (ref 11.7–15.4)
WBC: 5.5 10*3/uL (ref 3.4–10.8)

## 2024-06-22 LAB — CMP14+EGFR
ALT: 15 IU/L (ref 0–32)
AST: 19 IU/L (ref 0–40)
Albumin: 4.5 g/dL (ref 3.9–4.9)
Alkaline Phosphatase: 66 IU/L (ref 44–121)
BUN/Creatinine Ratio: 28 (ref 12–28)
BUN: 22 mg/dL (ref 8–27)
Bilirubin Total: 0.4 mg/dL (ref 0.0–1.2)
CO2: 19 mmol/L — ABNORMAL LOW (ref 20–29)
Calcium: 9.2 mg/dL (ref 8.7–10.3)
Chloride: 99 mmol/L (ref 96–106)
Creatinine, Ser: 0.79 mg/dL (ref 0.57–1.00)
Globulin, Total: 2.4 g/dL (ref 1.5–4.5)
Glucose: 108 mg/dL — ABNORMAL HIGH (ref 70–99)
Potassium: 4.7 mmol/L (ref 3.5–5.2)
Sodium: 137 mmol/L (ref 134–144)
Total Protein: 6.9 g/dL (ref 6.0–8.5)
eGFR: 80 mL/min/{1.73_m2} (ref >59)

## 2024-06-22 LAB — TSH+FREE T4
Free T4: 1.44 ng/dL (ref 0.82–1.77)
TSH: 2.02 u[IU]/mL (ref 0.450–4.500)

## 2024-06-28 ENCOUNTER — Telehealth: Payer: Self-pay

## 2024-06-28 NOTE — Telephone Encounter (Signed)
 I advised patient that Dr Alvia has not resulted the labs to us . She is going to try and log into MyChart. She will call me back if there is a problem.

## 2024-06-28 NOTE — Telephone Encounter (Signed)
 Copied from CRM [id number]. Topic: Clinical - Lab/Test Results >> Jun 27, 2024 10:47 AM Kevelyn M wrote: Reason for CRM: Patient is locked out of Mychart and would like to know the lab results from last week. Please give her a call #[phone]

## 2024-06-29 ENCOUNTER — Ambulatory Visit: Payer: Self-pay | Admitting: Family Medicine

## 2024-07-06 ENCOUNTER — Other Ambulatory Visit: Payer: Self-pay | Admitting: Family Medicine

## 2024-07-06 MED ORDER — METRONIDAZOLE 1 % EX GEL: Freq: Every day | CUTANEOUS | 3 refills | Status: AC

## 2024-07-13 ENCOUNTER — Other Ambulatory Visit: Payer: Self-pay | Admitting: Family Medicine

## 2024-07-13 DIAGNOSIS — Z1231 Encounter for screening mammogram for malignant neoplasm of breast: Secondary | ICD-10-CM

## 2024-07-19 ENCOUNTER — Ambulatory Visit

## 2024-07-19 VITALS — Ht 61.75 in | Wt 194.0 lb

## 2024-07-19 DIAGNOSIS — Z0000 Encounter for general adult medical examination without abnormal findings: Secondary | ICD-10-CM | POA: Diagnosis not present

## 2024-07-19 DIAGNOSIS — Z13820 Encounter for screening for osteoporosis: Secondary | ICD-10-CM

## 2024-07-19 NOTE — Progress Notes (Signed)
 Subjective:   Mary [name] is a 68 y.o. female who presents for Medicare Annual (Subsequent) preventive examination.  Visit Complete: Virtual I connected with  Mary [name] on 07/19/24 by a audio enabled telemedicine application and verified that I am speaking with the correct person using two identifiers.  Patient Location: Home  Provider Location: Office/Clinic  I discussed the limitations of evaluation and management by telemedicine. The patient expressed understanding and agreed to proceed.  Vital Signs: Because this visit was a virtual/telehealth visit, some criteria may be missing or patient reported. Any vitals not documented were not able to be obtained and vitals that have been documented are patient reported.  Patient Medicare AWV questionnaire was completed by the patient on n/a; I have confirmed that all information answered by patient is correct and no changes since this date.  Cardiac Risk Factors include: advanced age (>68men, >55 women);diabetes mellitus;obesity (BMI >30kg/m2);hypertension;dyslipidemia;family history of premature cardiovascular disease     Objective:    Today's Vitals   07/19/24 1504  Weight: 194 lb (88 kg)  Height: 5' 1.75 (1.568 m)   Body mass index is 35.77 kg/m.     07/19/2024    3:26 PM 07/13/2023    3:31 PM 06/30/2022    2:58 PM 05/23/2021    2:12 PM  Advanced Directives  Does Patient Have a Medical Advance Directive? Yes Yes Yes No  Type of Advance Directive Living will Living will Living will   Does patient want to make changes to medical advance directive?  No - Patient declined No - Patient declined   Would patient like information on creating a medical advance directive?    No - Patient declined    Current Medications (verified) Outpatient Encounter Medications as of 07/19/2024  Medication Sig   aspirin 81 MG tablet Take 81 mg by mouth daily.   atorvastatin  (LIPITOR) 10 MG tablet TAKE 1 TABLET BY MOUTH DAILY   blood glucose  meter kit and supplies Dispense based on patient and insurance preference. Use up to four times daily as directed. (FOR ICD-10 E10.9, E11.9).   Cholecalciferol (VITAMIN D3) 2000 UNITS TABS Take 1 tablet by mouth 2 (two) times daily.    empagliflozin  (JARDIANCE ) 25 MG TABS tablet TAKE 1 TABLET BY MOUTH DAILY BEFORE BREAKFAST   esomeprazole  (NEXIUM ) 40 MG capsule TAKE 1 CAPSULE BY MOUTH DAILY   glimepiride  (AMARYL ) 4 MG tablet TAKE 1 TABLET BY MOUTH 2 TIMES A DAY   glucose blood (ONETOUCH VERIO) test strip USE AS DIRECTED TO CHECK BLOOD SUGARS THREE TIMES A DAY   metFORMIN  (GLUCOPHAGE ) 500 MG tablet TAKE 2 TABLETS BY MOUTH TWICE A DAY WITH A MEAL   metroNIDAZOLE  (METROGEL ) 1 % gel Apply topically daily.   Microlet Lancets MISC USE TO CHECK BLOOD SUGAR LEVELS UP TO FOUR TIMES A DAY AS DIRECTED   Multiple Vitamin (MULTIVITAMIN) tablet Take 1 tablet by mouth daily.   pioglitazone  (ACTOS ) 30 MG tablet TAKE 1 TABLET BY MOUTH DAILY   telmisartan -hydrochlorothiazide (MICARDIS  HCT) 40-12.5 MG tablet TAKE 1 TABLET BY MOUTH DAILY   No facility-administered encounter medications on file as of 07/19/2024.    Allergies (verified) Codeine   History: Past Medical History:  Diagnosis Date   Breast cancer (HCC) 10/2008   Left breast invasive ductal carcinoma   Diabetes mellitus without complication (HCC)    GERD (gastroesophageal reflux disease)    Hyperlipidemia    Hypertension    Osteopenia    Past Surgical History:  Procedure Laterality Date  ABDOMINAL HYSTERECTOMY  11/1998   BREAST LUMPECTOMY WITH AXILLARY LYMPH NODE BIOPSY Left    Lumpectomy in 11/2008 and axillary biopsy in 12/2008   DILATION AND CURETTAGE OF UTERUS  08/1998   Family History  Problem Relation Age of Onset   Heart attack Mother    Hypertension Mother    Diabetes Mother    Cancer Father        Prostate cancer   Heart disease Father    Hypertension Brother    Social History   Socioeconomic History   Marital status:  Divorced    Spouse name: Not on file   Number of children: 0   Years of education: 13   Highest education level: Some college, no degree  Occupational History    Comment: Retired  Tobacco Use   Smoking status: Former   Smokeless tobacco: Never   Tobacco comments:    Quit smoking in 1990  Vaping Use   Vaping status: Never Used  Substance and Sexual Activity   Alcohol use: No   Drug use: Never   Sexual activity: Never    Birth control/protection: Surgical, Post-menopausal  Other Topics Concern   Not on file  Social History Narrative   Lives alone. Enjoys reading, doing yard work, church activities & silver sneakers.   Social Drivers of Corporate investment banker Strain: Low Risk  (07/19/2024)   Overall Financial Resource Strain (CARDIA)    Difficulty of Paying Living Expenses: Not hard at all  Food Insecurity: No Food Insecurity (07/19/2024)   Hunger Vital Sign    Worried About Running Out of Food in the Last Year: Never true    Ran Out of Food in the Last Year: Never true  Transportation Needs: No Transportation Needs (07/19/2024)   PRAPARE - Administrator, Civil Service (Medical): No    Lack of Transportation (Non-Medical): No  Physical Activity: Sufficiently Active (07/19/2024)   Exercise Vital Sign    Days of Exercise per Week: 3 days    Minutes of Exercise per Session: 50 min  Stress: No Stress Concern Present (07/19/2024)   [employer] of Occupational Health - Occupational Stress Questionnaire    Feeling of Stress: Not at all  Social Connections: Moderately Integrated (07/19/2024)   Social Connection and Isolation Panel    Frequency of Communication with Friends and Family: More than three times a week    Frequency of Social Gatherings with Friends and Family: More than three times a week    Attends Religious Services: More than 4 times per year    Active Member of [employer] or Organizations: Yes    Attends Engineer, structural: More than 4  times per year    Marital Status: Divorced    Tobacco Counseling Counseling given: Not Answered Tobacco comments: Quit smoking in 1990   Clinical Intake:  Pre-visit preparation completed: Yes  Pain : No/denies pain     BMI - recorded: 35.77 Nutritional Status: BMI > 30  Obese Nutritional Risks: None Diabetes: Yes CBG done?: Yes (144 mg/dl) CBG resulted in Enter/ Edit results?: No Did pt. [name] in CBG monitor from home?: No  How often do you need to have someone help you when you read instructions, pamphlets, or other written materials from your doctor or pharmacy?: 1 - Never What is the last grade level you completed in school?: 13  Interpreter Needed?: No      Activities of Daily Living    07/19/2024  3:11 PM  In your present state of health, do you have any difficulty performing the following activities:  Hearing? 0  Vision? 0  Difficulty concentrating or making decisions? 0  Walking or climbing stairs? 0  Dressing or bathing? 0  Doing errands, shopping? 0  Preparing Food and eating ? N  Using the Toilet? N  In the past six months, have you accidently leaked urine? N  Do you have problems with loss of bowel control? N  Managing your Medications? N  Managing your Finances? N  Housekeeping or managing your Housekeeping? N    Patient Care Team: Mary Bring, DO as PCP - General (Family Medicine)  Indicate any recent Medical Services you may have received from other than [facility] providers in the past year (date may be approximate).     Assessment:   This is a routine wellness examination for Mary [name].  Hearing/Vision screen No results found.   Goals Addressed             This Visit's Progress    Patient Stated       Patient states she would like to lose weight.        Depression Screen    07/19/2024    3:24 PM 06/21/2024    8:29 AM 02/26/2024    8:18 AM 07/13/2023    3:36 PM 02/23/2023    8:54 AM 06/30/2022    3:01 PM 03/21/2022    8:21 AM  PHQ  2/9 Scores  PHQ - 2 Score 0 0 0 0 0 0 0    Fall Risk    07/19/2024    3:27 PM 06/21/2024    8:29 AM 02/26/2024    8:18 AM 07/13/2023    3:36 PM 02/23/2023    8:54 AM  Fall Risk   Falls in the past year? 0 0 0 0 0  Number falls in past yr: 0 0 0 0 0  Injury with Fall? 0 0 0 0 0  Risk for fall due to : No Fall Risks No Fall Risks No Fall Risks No Fall Risks   Follow up Falls evaluation completed Falls evaluation completed Falls evaluation completed Falls evaluation completed     MEDICARE RISK AT HOME: Medicare Risk at Home Any stairs in or around the home?: Yes If so, are there any without handrails?: Yes Home free of loose throw rugs in walkways, pet beds, electrical cords, etc?: Yes Adequate lighting in your home to reduce risk of falls?: Yes Life alert?: No Use of a cane, walker or w/c?: No Grab bars in the bathroom?: No Shower chair or bench in shower?: No Elevated toilet seat or a handicapped toilet?: Yes  TIMED UP AND GO:  Was the test performed?  No    Cognitive Function:        07/19/2024    3:30 PM 07/13/2023    3:44 PM 06/30/2022    3:12 PM 05/23/2021    2:30 PM  6CIT Screen  What Year? 0 points 0 points 0 points 0 points  What month? 0 points 0 points 0 points 0 points  What time? 0 points 0 points 0 points 0 points  Count back from 20 0 points 0 points 0 points 0 points  Months in reverse 0 points 0 points 0 points 0 points  Repeat phrase 0 points 0 points 0 points 0 points  Total Score 0 points 0 points 0 points 0 points    Immunizations Immunization History  Administered Date(s)  Administered   PFIZER(Purple Top)SARS-COV-2 Vaccination 05/12/2020, 06/02/2020   Tdap 12/22/2016    TDAP status: Up to date  Flu Vaccine status: Declined, Education has been provided regarding the importance of this vaccine but patient still declined. Advised may receive this vaccine at local pharmacy or Health Dept. Aware to provide a copy of the vaccination record if obtained  from local pharmacy or Health Dept. Verbalized acceptance and understanding.  Pneumococcal vaccine status: Declined,  Education has been provided regarding the importance of this vaccine but patient still declined. Advised may receive this vaccine at local pharmacy or Health Dept. Aware to provide a copy of the vaccination record if obtained from local pharmacy or Health Dept. Verbalized acceptance and understanding.   Covid-19 vaccine status: Declined, Education has been provided regarding the importance of this vaccine but patient still declined. Advised may receive this vaccine at local pharmacy or Health Dept.or vaccine clinic. Aware to provide a copy of the vaccination record if obtained from local pharmacy or Health Dept. Verbalized acceptance and understanding.  Qualifies for Shingles Vaccine? Yes   Zostavax completed No   Shingrix Completed?: No.    Education has been provided regarding the importance of this vaccine. Patient has been advised to call insurance company to determine out of pocket expense if they have not yet received this vaccine. Advised may also receive vaccine at local pharmacy or Health Dept. Verbalized acceptance and understanding.  Screening Tests Health Maintenance  Topic Date Due   Hepatitis C Screening  Never done   Colonoscopy  04/10/2014   DEXA SCAN  07/17/2024   COVID-19 Vaccine (3 - Pfizer risk series) 08/28/2024 (Originally 06/30/2020)   Zoster Vaccines- Shingrix (1 of 2) 08/28/2024 (Originally 03/29/1975)   Pneumococcal Vaccine: 50+ Years (1 of 2 - PCV) 08/28/2025 (Originally 03/29/1975)   INFLUENZA VACCINE  07/22/2024   OPHTHALMOLOGY EXAM  08/11/2024   FOOT EXAM  08/25/2024   Diabetic kidney evaluation - Urine ACR  11/15/2024   HEMOGLOBIN A1C  12/22/2024   Diabetic kidney evaluation - eGFR measurement  06/21/2025   Medicare Annual Wellness (AWV)  07/19/2025   MAMMOGRAM  08/05/2025   DTaP/Tdap/Td (2 - Td or Tdap) 12/22/2026   Hepatitis B Vaccines  Aged Out    HPV VACCINES  Aged Out   Meningococcal B Vaccine  Aged Out    Health Maintenance  Health Maintenance Due  Topic Date Due   Hepatitis C Screening  Never done   Colonoscopy  04/10/2014   DEXA SCAN  07/17/2024    Colon Cancer Screening - patient declined at this time  Mammogram status: Completed 08/06/2023. Repeat every year  Bone Density status: Completed 07/17/2021. Results reflect: Bone density results: OSTEOPENIA. Repeat every 2 years.  Lung Cancer Screening: (Low Dose CT Chest recommended if Age 23-80 years, 20 pack-year currently smoking OR have quit w/in 15years.) does not qualify.   Lung Cancer Screening Referral: n/a  Additional Screening:  Hepatitis C Screening: does qualify; Completed not yet  Vision Screening: Recommended annual ophthalmology exams for early detection of glaucoma and other disorders of the eye. Is the patient up to date with their annual eye exam?  Yes  Who is the provider or what is the name of the office in which the patient attends annual eye exams? [facility] If pt is not established with a provider, would they like to be referred to a provider to establish care? N/a.   Dental Screening: Recommended annual dental exams for proper oral hygiene  Diabetic Foot Exam: Diabetic Foot Exam: Completed 08/26/2023  Community Resource Referral / Chronic Care Management: CRR required this visit?  No   CCM required this visit?  No     Plan:     I have personally reviewed and noted the following in the patient's chart:   Medical and social history Use of alcohol, tobacco or illicit drugs  Current medications and supplements including opioid prescriptions. Patient is not currently taking opioid prescriptions. Functional ability and status Nutritional status Physical activity Advanced directives List of other physicians Hospitalizations, surgeries, and ER visits in previous 12 months. None Vitals Screenings to include cognitive,  depression, and falls Referrals and appointments  In addition, I have reviewed and discussed with patient certain preventive protocols, quality metrics, and best practice recommendations. A written personalized care plan for preventive services as well as general preventive health recommendations were provided to patient.     Mary [name], CMA   07/19/2024   After Visit Summary: (MyChart) Due to this being a telephonic visit, the after visit summary with patients personalized plan was offered to patient via MyChart   Nurse Notes:   Mary [name] is a 68 y.o. female patient of Mary Bring, DO who had a Medicare Annual Wellness Visit today via telephone. Mary [name] is Retired and lives alone. She does not have any children. She reports that she is socially active and does interact with friends/family regularly. she is moderately physically active and enjoys reading, yard work, church activities, get together with friends and silver sneakers.   Bone density ordered.

## 2024-07-19 NOTE — Patient Instructions (Signed)
  Mary [name] , Thank you for taking time to come for your Medicare Wellness Visit. I appreciate your ongoing commitment to your health goals. Please review the following plan we discussed and let me know if I can assist you in the future.   These are the goals we discussed:  Goals       Patient Stated (pt-stated)      05/23/2021 AWV Goal: Exercise for General Health  Patient will verbalize understanding of the benefits of increased physical activity: Exercising regularly is important. It will improve your overall fitness, flexibility, and endurance. Regular exercise also will improve your overall health. It can help you control your weight, reduce stress, and improve your bone density. Over the next year, patient will increase physical activity as tolerated with a goal of at least 150 minutes of moderate physical activity per week.  You can tell that you are exercising at a moderate intensity if your heart starts beating faster and you start breathing faster but can still hold a conversation. Moderate-intensity exercise ideas include: Walking 1 mile (1.6 km) in about 15 minutes Biking Hiking Golfing Dancing Water aerobics Patient will verbalize understanding of everyday activities that increase physical activity by providing examples like the following: Yard work, such as: Insurance underwriter Gardening Washing windows or floors Patient will be able to explain general safety guidelines for exercising:  Before you start a new exercise program, talk with your health care provider. Do not exercise so much that you hurt yourself, feel dizzy, or get very short of breath. Wear comfortable clothes and wear shoes with good support. Drink plenty of water while you exercise to prevent dehydration or heat stroke. Work out until your breathing and your heartbeat get faster.       Patient Stated (pt-stated)       Would like to loose weight.      Patient Stated (pt-stated)      Patient stated that she would like to loose weight and go to silver sneakers 3 times a week.       Patient Stated      Patient states she would like to lose weight.         This is a list of the screening recommended for you and due dates:  Health Maintenance  Topic Date Due   Hepatitis C Screening  Never done   Colon Cancer Screening  04/10/2014   DEXA scan (bone density measurement)  07/17/2024   COVID-19 Vaccine (3 - Pfizer risk series) 08/28/2024*   Zoster (Shingles) Vaccine (1 of 2) 08/28/2024*   Pneumococcal Vaccine for age over 40 (1 of 2 - PCV) 08/28/2025*   Flu Shot  07/22/2024   Eye exam for diabetics  08/11/2024   Complete foot exam   08/25/2024   Yearly kidney health urinalysis for diabetes  11/15/2024   Hemoglobin A1C  12/22/2024   Yearly kidney function blood test for diabetes  06/21/2025   Medicare Annual Wellness Visit  07/19/2025   Mammogram  08/05/2025   DTaP/Tdap/Td vaccine (2 - Td or Tdap) 12/22/2026   Hepatitis B Vaccine  Aged Out   HPV Vaccine  Aged Out   Meningitis B Vaccine  Aged Out  *Topic was postponed. The date shown is not the original due date.

## 2024-08-04 ENCOUNTER — Other Ambulatory Visit: Payer: Self-pay | Admitting: Family Medicine

## 2024-08-04 DIAGNOSIS — E1169 Type 2 diabetes mellitus with other specified complication: Secondary | ICD-10-CM

## 2024-08-09 ENCOUNTER — Ambulatory Visit
Admission: RE | Admit: 2024-08-09 | Discharge: 2024-08-09 | Disposition: A | Discharge status: Home / Self Care | Source: Ambulatory Visit | Attending: Family Medicine | Admitting: Family Medicine

## 2024-08-09 DIAGNOSIS — Z1231 Encounter for screening mammogram for malignant neoplasm of breast: Secondary | ICD-10-CM | POA: Diagnosis not present

## 2024-08-10 ENCOUNTER — Ambulatory Visit (INDEPENDENT_AMBULATORY_CARE_PROVIDER_SITE_OTHER)

## 2024-08-10 DIAGNOSIS — Z13820 Encounter for screening for osteoporosis: Secondary | ICD-10-CM | POA: Diagnosis not present

## 2024-08-10 DIAGNOSIS — Z780 Asymptomatic menopausal state: Secondary | ICD-10-CM | POA: Diagnosis not present

## 2024-08-10 DIAGNOSIS — M8589 Other specified disorders of bone density and structure, multiple sites: Secondary | ICD-10-CM | POA: Diagnosis not present

## 2024-08-17 DIAGNOSIS — H2513 Age-related nuclear cataract, bilateral: Secondary | ICD-10-CM | POA: Diagnosis not present

## 2024-08-17 DIAGNOSIS — H40013 Open angle with borderline findings, low risk, bilateral: Secondary | ICD-10-CM | POA: Diagnosis not present

## 2024-08-17 DIAGNOSIS — E119 Type 2 diabetes mellitus without complications: Secondary | ICD-10-CM | POA: Diagnosis not present

## 2024-08-17 DIAGNOSIS — H35033 Hypertensive retinopathy, bilateral: Secondary | ICD-10-CM | POA: Diagnosis not present

## 2024-08-17 LAB — HM DIABETES EYE EXAM

## 2024-09-11 NOTE — Progress Notes (Signed)
 Referring-Cody Will, DO Reason for referral-SVT  HPI: 68 year old female for evaluation of SVT at request of Velma Will, DO.  Patient seen June 21, 2024 and electrocardiogram showed SVT at a rate of 150.  This apparently broke with Valsalva but no strips available.  Cardiology now asked to evaluate.  Note laboratories at that time showed potassium 4.7, creatinine 0.79, hemoglobin 11.5, TSH 2.020, free T41.44.  Patient has had intermittent palpitations since 2021.  They are sudden in onset and described as her heart fluttering.  Last several minutes and resolve spontaneously.  No associated symptoms.  Otherwise denies dyspnea on exertion, orthopnea, PND, pedal edema, exertional chest pain or syncope.  Current Outpatient Medications  Medication Sig Dispense Refill   aspirin 81 MG tablet Take 81 mg by mouth daily.     atorvastatin  (LIPITOR) 10 MG tablet TAKE 1 TABLET BY MOUTH DAILY 90 tablet 3   blood glucose meter kit and supplies Dispense based on patient and insurance preference. Use up to four times daily as directed. (FOR ICD-10 E10.9, E11.9). 1 each 0   Cholecalciferol (VITAMIN D3) 2000 UNITS TABS Take 1 tablet by mouth 2 (two) times daily.      empagliflozin  (JARDIANCE ) 25 MG TABS tablet TAKE 1 TABLET BY MOUTH DAILY BEFORE BREAKFAST 90 tablet 3   esomeprazole  (NEXIUM ) 40 MG capsule TAKE 1 CAPSULE BY MOUTH DAILY 90 capsule 3   glimepiride  (AMARYL ) 4 MG tablet TAKE 1 TABLET BY MOUTH 2 TIMES A DAY 180 tablet 0   glucose blood (ONETOUCH VERIO) test strip USE AS DIRECTED TO CHECK BLOOD SUGARS THREE TIMES A DAY 100 strip 3   metFORMIN  (GLUCOPHAGE ) 500 MG tablet TAKE 2 TABLETS BY MOUTH TWICE A DAY WITH A MEAL 360 tablet 3   metroNIDAZOLE  (METROGEL ) 1 % gel Apply topically daily. 45 g 3   Microlet Lancets MISC USE TO CHECK BLOOD SUGAR LEVELS UP TO FOUR TIMES A DAY AS DIRECTED 100 each 5   Multiple Vitamin (MULTIVITAMIN) tablet Take 1 tablet by mouth daily.     pioglitazone  (ACTOS ) 30 MG  tablet TAKE 1 TABLET BY MOUTH DAILY 90 tablet 3   telmisartan -hydrochlorothiazide (MICARDIS  HCT) 40-12.5 MG tablet TAKE 1 TABLET BY MOUTH DAILY 90 tablet 1   No current facility-administered medications for this visit.    Allergies  Allergen Reactions   Codeine Nausea And Vomiting     Past Medical History:  Diagnosis Date   Breast cancer (HCC) 10/2008   Left breast invasive ductal carcinoma   Diabetes mellitus without complication (HCC)    GERD (gastroesophageal reflux disease)    Hyperlipidemia    Hypertension    Osteopenia     Past Surgical History:  Procedure Laterality Date   ABDOMINAL HYSTERECTOMY  11/1998   BREAST LUMPECTOMY WITH AXILLARY LYMPH NODE BIOPSY Left    Lumpectomy in 11/2008 and axillary biopsy in 12/2008   DILATION AND CURETTAGE OF UTERUS  08/1998    Social History   Socioeconomic History   Marital status: Divorced    Spouse name: Not on file   Number of children: 0   Years of education: 13   Highest education level: Some college, no degree  Occupational History    Comment: Retired  Tobacco Use   Smoking status: Never   Smokeless tobacco: Never  Vaping Use   Vaping status: Never Used  Substance and Sexual Activity   Alcohol use: No   Drug use: Never   Sexual activity: Never  Birth control/protection: Surgical, Post-menopausal  Other Topics Concern   Not on file  Social History Narrative   Lives alone. Enjoys reading, doing yard work, church activities & silver sneakers.   Social Drivers of Corporate investment banker Strain: Low Risk  (07/19/2024)   Overall Financial Resource Strain (CARDIA)    Difficulty of Paying Living Expenses: Not hard at all  Food Insecurity: No Food Insecurity (07/19/2024)   Hunger Vital Sign    Worried About Running Out of Food in the Last Year: Never true    Ran Out of Food in the Last Year: Never true  Transportation Needs: No Transportation Needs (07/19/2024)   PRAPARE - Scientist, research (physical sciences) (Medical): No    Lack of Transportation (Non-Medical): No  Physical Activity: Sufficiently Active (07/19/2024)   Exercise Vital Sign    Days of Exercise per Week: 3 days    Minutes of Exercise per Session: 50 min  Stress: No Stress Concern Present (07/19/2024)   [employer] of Occupational Health - Occupational Stress Questionnaire    Feeling of Stress: Not at all  Social Connections: Moderately Integrated (07/19/2024)   Social Connection and Isolation Panel    Frequency of Communication with Friends and Family: More than three times a week    Frequency of Social Gatherings with Friends and Family: More than three times a week    Attends Religious Services: More than 4 times per year    Active Member of [employer] or Organizations: Yes    Attends Engineer, structural: More than 4 times per year    Marital Status: Divorced  Intimate Partner Violence: Not At Risk (07/19/2024)   Humiliation, Afraid, Rape, and Kick questionnaire    Fear of Current or Ex-Partner: No    Emotionally Abused: No    Physically Abused: No    Sexually Abused: No    Family History  Problem Relation Age of Onset   Heart attack Mother    Hypertension Mother    Diabetes Mother    Cancer Father        Prostate cancer   Heart disease Father    Hypertension Brother    Breast cancer Neg Hx     ROS: no fevers or chills, productive cough, hemoptysis, dysphasia, odynophagia, melena, hematochezia, dysuria, hematuria, rash, seizure activity, orthopnea, PND, pedal edema, claudication. Remaining systems are negative.  Physical Exam:   Blood pressure 106/62, pulse (!) 106, height 5' 1 (1.549 m), weight 205 lb 1.6 oz (93 kg), SpO2 95%.  General:  Well developed/well nourished in NAD Skin warm/dry Patient not depressed No peripheral clubbing Back-normal HEENT-normal/normal eyelids Neck supple/normal carotid upstroke bilaterally; no bruits; no JVD; no thyromegaly chest - CTA/ normal  expansion CV - RRR/normal S1 and S2; no murmurs, rubs or gallops;  PMI nondisplaced Abdomen -NT/ND, no HSM, no mass, + bowel sounds, no bruit 2+ femoral pulses, no bruits Ext-no edema, chords, 2+ DP Neuro-grossly nonfocal  ECG -June 21, 2024-SVT at a rate of 150.  Personally reviewed  A/P  1 SVT-Will schedule echocardiogram to assess LV function.  We discussed initiating Toprol today.  However she would prefer to continue with her present medications.  We will add in the future if needed.  If her symptoms worsen can also consider referral to EP for consideration of ablation.  2 hypertension-blood pressure controlled.  Continue present medications.  3 hyperlipidemia-continue statin.  Will check calcium  score given history of diabetes mellitus.  Redell Shallow, MD

## 2024-09-22 ENCOUNTER — Ambulatory Visit: Payer: Self-pay | Admitting: Cardiology

## 2024-09-22 ENCOUNTER — Ambulatory Visit (HOSPITAL_COMMUNITY)
Admission: RE | Admit: 2024-09-22 | Discharge: 2024-09-22 | Disposition: A | Discharge status: Home / Self Care | Payer: Self-pay | Source: Ambulatory Visit | Attending: Cardiology | Admitting: Cardiology

## 2024-09-22 ENCOUNTER — Encounter: Payer: Self-pay | Admitting: Cardiology

## 2024-09-22 ENCOUNTER — Ambulatory Visit: Attending: Cardiology | Admitting: Cardiology

## 2024-09-22 VITALS — BP 106/62 | HR 106 | Ht 61.0 in | Wt 205.1 lb

## 2024-09-22 DIAGNOSIS — I471 Supraventricular tachycardia, unspecified: Secondary | ICD-10-CM

## 2024-09-22 DIAGNOSIS — I10 Essential (primary) hypertension: Secondary | ICD-10-CM

## 2024-09-22 DIAGNOSIS — E785 Hyperlipidemia, unspecified: Secondary | ICD-10-CM | POA: Diagnosis not present

## 2024-09-22 DIAGNOSIS — E1169 Type 2 diabetes mellitus with other specified complication: Secondary | ICD-10-CM

## 2024-09-22 DIAGNOSIS — R918 Other nonspecific abnormal finding of lung field: Secondary | ICD-10-CM

## 2024-09-22 NOTE — Patient Instructions (Signed)
   Testing/Procedures:  CORONARY CALCIUM  SCORING CT SCAN AT [facility]   Your physician has requested that you have an echocardiogram. Echocardiography is a painless test that uses sound waves to create images of your heart. It provides your doctor with information about the size and shape of your heart and how well your heart's chambers and valves are working. This procedure takes approximately one hour. There are no restrictions for this procedure. Please do NOT wear cologne, perfume, aftershave, or lotions (deodorant is allowed). Please arrive 15 minutes prior to your appointment time.  Please note: We ask at that you not bring children with you during ultrasound (echo/ vascular) testing. Due to room size and safety concerns, children are not allowed in the ultrasound rooms during exams. Our front office staff cannot provide observation of children in our lobby area while testing is being conducted. An adult accompanying a patient to their appointment will only be allowed in the ultrasound room at the discretion of the ultrasound technician under special circumstances. We apologize for any inconvenience. [address]  Follow-Up: At [facility], you and your health needs are our priority.  As part of our continuing mission to provide you with exceptional heart care, our providers are all part of one team.  This team includes your primary Cardiologist (physician) and Advanced Practice Providers or APPs (Physician Assistants and Nurse Practitioners) who all work together to provide you with the care you need, when you need it.  Your next appointment:   6 month(s)  Provider:   REDELL SHALLOW MD

## 2024-09-23 MED ORDER — ATORVASTATIN CALCIUM 40 MG PO TABS: 40.0000 mg | ORAL_TABLET | Freq: Every day | ORAL | 3 refills | Status: AC

## 2024-10-03 DIAGNOSIS — Z1212 Encounter for screening for malignant neoplasm of rectum: Secondary | ICD-10-CM | POA: Diagnosis not present

## 2024-10-03 DIAGNOSIS — Z1211 Encounter for screening for malignant neoplasm of colon: Secondary | ICD-10-CM | POA: Diagnosis not present

## 2024-10-04 LAB — COLOGUARD: Cologuard: NEGATIVE

## 2024-10-06 NOTE — Progress Notes (Signed)
 Mary [name]                                          MRN: [id number]   10/06/2024   The [facility] Quality Team Specialist reviewed this patient medical record for the purposes of chart review for care gap closure. The following were reviewed: chart review for care gap closure-kidney health evaluation for diabetes:eGFR  and uACR.    [facility] Quality Team

## 2024-10-07 LAB — COLOGUARD: COLOGUARD: NEGATIVE

## 2024-10-12 ENCOUNTER — Encounter: Payer: Self-pay | Admitting: Family Medicine

## 2024-10-13 ENCOUNTER — Ambulatory Visit: Payer: Self-pay | Admitting: Family Medicine

## 2024-10-13 ENCOUNTER — Other Ambulatory Visit: Payer: Self-pay | Admitting: Family Medicine

## 2024-10-13 DIAGNOSIS — E119 Type 2 diabetes mellitus without complications: Secondary | ICD-10-CM

## 2024-10-13 DIAGNOSIS — Z0000 Encounter for general adult medical examination without abnormal findings: Secondary | ICD-10-CM

## 2024-10-13 DIAGNOSIS — I10 Essential (primary) hypertension: Secondary | ICD-10-CM

## 2024-10-13 DIAGNOSIS — E1169 Type 2 diabetes mellitus with other specified complication: Secondary | ICD-10-CM

## 2024-11-01 ENCOUNTER — Ambulatory Visit (HOSPITAL_COMMUNITY)
Admission: RE | Admit: 2024-11-01 | Discharge: 2024-11-01 | Disposition: A | Discharge status: Home / Self Care | Source: Ambulatory Visit | Attending: Cardiovascular Disease | Admitting: Cardiovascular Disease

## 2024-11-01 DIAGNOSIS — I10 Essential (primary) hypertension: Secondary | ICD-10-CM | POA: Diagnosis not present

## 2024-11-01 DIAGNOSIS — I471 Supraventricular tachycardia, unspecified: Secondary | ICD-10-CM | POA: Diagnosis not present

## 2024-11-01 DIAGNOSIS — E119 Type 2 diabetes mellitus without complications: Secondary | ICD-10-CM | POA: Insufficient documentation

## 2024-11-01 DIAGNOSIS — E785 Hyperlipidemia, unspecified: Secondary | ICD-10-CM | POA: Insufficient documentation

## 2024-11-01 LAB — ECHOCARDIOGRAM COMPLETE
Area-P 1/2: 4.6 cm2
S' Lateral: 2.9 cm

## 2024-11-02 ENCOUNTER — Other Ambulatory Visit: Payer: Self-pay | Admitting: Family Medicine

## 2024-11-02 DIAGNOSIS — E669 Obesity, unspecified: Secondary | ICD-10-CM

## 2024-11-02 DIAGNOSIS — I10 Essential (primary) hypertension: Secondary | ICD-10-CM

## 2024-11-14 DIAGNOSIS — E1169 Type 2 diabetes mellitus with other specified complication: Secondary | ICD-10-CM | POA: Diagnosis not present

## 2024-11-14 DIAGNOSIS — E785 Hyperlipidemia, unspecified: Secondary | ICD-10-CM | POA: Diagnosis not present

## 2024-11-14 DIAGNOSIS — Z0000 Encounter for general adult medical examination without abnormal findings: Secondary | ICD-10-CM | POA: Diagnosis not present

## 2024-11-14 DIAGNOSIS — I10 Essential (primary) hypertension: Secondary | ICD-10-CM | POA: Diagnosis not present

## 2024-11-15 LAB — BASIC METABOLIC PANEL WITH GFR
BUN/Creatinine Ratio: 24 (ref 12–28)
BUN: 19 mg/dL (ref 8–27)
CO2: 22 mmol/L (ref 20–29)
Calcium: 9.3 mg/dL (ref 8.7–10.3)
Chloride: 100 mmol/L (ref 96–106)
Creatinine, Ser: 0.8 mg/dL (ref 0.57–1.00)
Glucose: 119 mg/dL — ABNORMAL HIGH (ref 70–99)
Potassium: 4.4 mmol/L (ref 3.5–5.2)
Sodium: 137 mmol/L (ref 134–144)
eGFR: 79 mL/min/1.73 (ref >59)

## 2024-11-15 LAB — HEMOGLOBIN A1C
Est. average glucose Bld gHb Est-mCnc: 160 mg/dL
Hgb A1c MFr Bld: 7.2 % — ABNORMAL HIGH (ref 4.8–5.6)

## 2024-11-15 LAB — TSH: TSH: 2.69 u[IU]/mL (ref 0.450–4.500)

## 2024-11-15 LAB — LIPID PANEL WITH LDL/HDL RATIO
Cholesterol, Total: 122 mg/dL (ref 100–199)
HDL: 56 mg/dL (ref >39)
LDL Chol Calc (NIH): 48 mg/dL (ref 0–99)
LDL/HDL Ratio: 0.9 ratio (ref 0.0–3.2)
Triglycerides: 95 mg/dL (ref 0–149)
VLDL Cholesterol Cal: 18 mg/dL (ref 5–40)

## 2024-11-15 LAB — HEPATIC FUNCTION PANEL
ALT: 16 IU/L (ref 0–32)
AST: 18 IU/L (ref 0–40)
Albumin: 4.4 g/dL (ref 3.9–4.9)
Alkaline Phosphatase: 70 IU/L (ref 49–135)
Bilirubin Total: 0.4 mg/dL (ref 0.0–1.2)
Bilirubin, Direct: 0.17 mg/dL (ref 0.00–0.40)
Total Protein: 7.2 g/dL (ref 6.0–8.5)

## 2024-11-21 ENCOUNTER — Ambulatory Visit: Admitting: Family Medicine

## 2024-11-21 ENCOUNTER — Encounter: Payer: Self-pay | Admitting: Family Medicine

## 2024-11-21 VITALS — BP 103/65 | HR 109 | Ht 61.0 in | Wt 194.0 lb

## 2024-11-21 DIAGNOSIS — E669 Obesity, unspecified: Secondary | ICD-10-CM | POA: Diagnosis not present

## 2024-11-21 DIAGNOSIS — E785 Hyperlipidemia, unspecified: Secondary | ICD-10-CM | POA: Diagnosis not present

## 2024-11-21 DIAGNOSIS — Z7984 Long term (current) use of oral hypoglycemic drugs: Secondary | ICD-10-CM | POA: Diagnosis not present

## 2024-11-21 DIAGNOSIS — Z0000 Encounter for general adult medical examination without abnormal findings: Secondary | ICD-10-CM

## 2024-11-21 DIAGNOSIS — E1169 Type 2 diabetes mellitus with other specified complication: Secondary | ICD-10-CM

## 2024-11-21 NOTE — Assessment & Plan Note (Signed)
 Well adult Recent labs reviewed. Screenings: Up-to-date Immunizations: Declines all recommended immunizations including pneumonia, influenza and Shingrix vaccine.

## 2024-11-21 NOTE — Assessment & Plan Note (Signed)
 A1c increased some since previous visit.  Encouraged dietary changes

## 2024-11-21 NOTE — Assessment & Plan Note (Signed)
 Continue atorvastatin  at current strength.  I did discuss with her that with lower LDL levels we do see some plaque regression that is likely the reason that her cardiologist increased her atorvastatin  significantly. Lab Results  Component Value Date   LDLCALC 48 11/14/2024

## 2024-11-21 NOTE — Progress Notes (Signed)
 Mary [name] - 69 y.o. female MRN [id number]  Date of birth: 1956-08-09  Subjective Chief Complaint  Patient presents with   Annual Exam    HPI Mary [name] is a 67 y.o. female here today for annual exam.  She has concerns today that her cardiologist increased her atorvastatin  to 40 mg.  She did have a coronary calcium  score which was in the 69th percentile for her age.  Previous LDL was 68, now down to 48.  She felt like this was a large increase in her medication and is concerned that was not discussed with her prior to the change.  She is tolerating the increase without any side effects.  Incidentally a small pulmonary nodule was noted as well on radiology over read.  She is somewhat active.  Feels that she is doing okay with diet although her A1c has increased some.    She is a non-smoker.  No alcohol.  Review of Systems  Constitutional:  Negative for chills, fever, malaise/fatigue and weight loss.  HENT:  Negative for congestion, ear pain and sore throat.   Eyes:  Negative for blurred vision, double vision and pain.  Respiratory:  Negative for cough and shortness of breath.   Cardiovascular:  Negative for chest pain and palpitations.  Gastrointestinal:  Negative for abdominal pain, blood in stool, constipation, heartburn and nausea.  Genitourinary:  Negative for dysuria and urgency.  Musculoskeletal:  Negative for joint pain and myalgias.  Neurological:  Negative for dizziness and headaches.  Endo/Heme/Allergies:  Does not bruise/bleed easily.  Psychiatric/Behavioral:  Negative for depression. The patient is not nervous/anxious and does not have insomnia.        Allergies  Allergen Reactions   Codeine Nausea And Vomiting    Past Medical History:  Diagnosis Date   Breast cancer (HCC) 10/2008   Left breast invasive ductal carcinoma   Diabetes mellitus without complication (HCC)    GERD (gastroesophageal reflux disease)    Hyperlipidemia    Hypertension     Osteopenia     Past Surgical History:  Procedure Laterality Date   ABDOMINAL HYSTERECTOMY  11/1998   BREAST LUMPECTOMY WITH AXILLARY LYMPH NODE BIOPSY Left    Lumpectomy in 11/2008 and axillary biopsy in 12/2008   DILATION AND CURETTAGE OF UTERUS  08/1998    Social History   Socioeconomic History   Marital status: Divorced    Spouse name: Not on file   Number of children: 0   Years of education: 13   Highest education level: Some college, no degree  Occupational History    Comment: Retired  Tobacco Use   Smoking status: Never   Smokeless tobacco: Never  Vaping Use   Vaping status: Never Used  Substance and Sexual Activity   Alcohol use: No   Drug use: Never   Sexual activity: Never    Birth control/protection: Surgical, Post-menopausal  Other Topics Concern   Not on file  Social History Narrative   Lives alone. Enjoys reading, doing yard work, church activities & silver sneakers.   Social Drivers of Corporate Investment Banker Strain: Low Risk  (07/19/2024)   Overall Financial Resource Strain (CARDIA)    Difficulty of Paying Living Expenses: Not hard at all  Food Insecurity: No Food Insecurity (07/19/2024)   Hunger Vital Sign    Worried About Running Out of Food in the Last Year: Never true    Ran Out of Food in the Last Year: Never true  Transportation Needs: No Transportation Needs (  07/19/2024)   PRAPARE - Administrator, Civil Service (Medical): No    Lack of Transportation (Non-Medical): No  Physical Activity: Sufficiently Active (07/19/2024)   Exercise Vital Sign    Days of Exercise per Week: 3 days    Minutes of Exercise per Session: 50 min  Stress: No Stress Concern Present (07/19/2024)   [employer] of Occupational Health - Occupational Stress Questionnaire    Feeling of Stress: Not at all  Social Connections: Moderately Integrated (07/19/2024)   Social Connection and Isolation Panel    Frequency of Communication with Friends and Family: More  than three times a week    Frequency of Social Gatherings with Friends and Family: More than three times a week    Attends Religious Services: More than 4 times per year    Active Member of Clubs or Organizations: Yes    Attends Engineer, Structural: More than 4 times per year    Marital Status: Divorced    Family History  Problem Relation Age of Onset   Heart attack Mother    Hypertension Mother    Diabetes Mother    Cancer Father        Prostate cancer   Heart disease Father    Hypertension Brother    Breast cancer Neg Hx     Health Maintenance  Topic Date Due   Hepatitis C Screening  Never done   Zoster Vaccines- Shingrix (1 of 2) Never done   Diabetic kidney evaluation - Urine ACR  11/15/2024   Influenza Vaccine  03/21/2025 (Originally 07/22/2024)   Pneumococcal Vaccine: 50+ Years (1 of 2 - PCV) 08/28/2025 (Originally 03/29/1975)   COVID-19 Vaccine (3 - Pfizer risk series) 12/07/2025 (Originally 06/30/2020)   HEMOGLOBIN A1C  05/14/2025   Medicare Annual Wellness (AWV)  07/19/2025   Mammogram  08/09/2025   OPHTHALMOLOGY EXAM  08/17/2025   Diabetic kidney evaluation - eGFR measurement  11/14/2025   FOOT EXAM  11/21/2025   DTaP/Tdap/Td (2 - Td or Tdap) 12/22/2026   Bone Density Scan  08/11/2027   Fecal DNA (Cologuard)  10/05/2027   Meningococcal B Vaccine  Aged Out   Colonoscopy  Discontinued     ----------------------------------------------------------------------------------------------------------------------------------------------------------------------------------------------------------------- Physical Exam BP 103/65   Pulse (!) 109   Ht 5' 1 (1.549 m)   Wt 194 lb (88 kg)   SpO2 97%   BMI 36.66 kg/m   Physical Exam Constitutional:      General: She is not in acute distress. HENT:     Head: Normocephalic and atraumatic.     Right Ear: Tympanic membrane and ear canal normal.     Left Ear: Tympanic membrane and ear canal normal.     Nose: Nose  normal.  Eyes:     General: No scleral icterus.    Conjunctiva/sclera: Conjunctivae normal.  Neck:     Thyroid: No thyromegaly.  Cardiovascular:     Rate and Rhythm: Normal rate and regular rhythm.     Heart sounds: Normal heart sounds.  Pulmonary:     Effort: Pulmonary effort is normal.     Breath sounds: Normal breath sounds.  Abdominal:     General: Bowel sounds are normal. There is no distension.     Palpations: Abdomen is soft.     Tenderness: There is no abdominal tenderness. There is no guarding.  Musculoskeletal:        General: Normal range of motion.     Cervical back: Normal range of motion and  neck supple.  Lymphadenopathy:     Cervical: No cervical adenopathy.  Skin:    General: Skin is warm and dry.     Findings: No rash.  Neurological:     General: No focal deficit present.     Mental Status: She is alert and oriented to person, place, and time.     Cranial Nerves: No cranial nerve deficit.     Coordination: Coordination normal.  Psychiatric:        Mood and Affect: Mood normal.        Behavior: Behavior normal.     ------------------------------------------------------------------------------------------------------------------------------------------------------------------------------------------------------------------- Assessment and Plan  Hyperlipidemia associated with type 2 diabetes mellitus (HCC) Continue atorvastatin  at current strength.  I did discuss with her that with lower LDL levels we do see some plaque regression that is likely the reason that her cardiologist increased her atorvastatin  significantly. Lab Results  Component Value Date   LDLCALC 48 11/14/2024     Type 2 diabetes mellitus in patient with obesity (HCC) A1c increased some since previous visit.  Encouraged dietary changes  Well adult exam Well adult Recent labs reviewed. Screenings: Up-to-date Immunizations: Declines all recommended immunizations including pneumonia,  influenza and Shingrix vaccine.   No orders of the defined types were placed in this encounter.   Return in about 6 months (around 05/22/2025) for Hypertension, Type 2 Diabetes.

## 2024-11-29 ENCOUNTER — Encounter: Payer: Self-pay | Admitting: *Deleted

## 2024-12-02 NOTE — Progress Notes (Signed)
 Mary [name]                                          MRN: [id number]   12/02/2024   The [facility] Quality Team Specialist reviewed this patient medical record for the purposes of chart review for care gap closure. The following were reviewed: chart review for care gap closure-kidney health evaluation for diabetes:eGFR  and uACR.    [facility]I Quality Team

## 2025-01-11 NOTE — Progress Notes (Signed)
 Mary [name]                                          MRN: [id number]   01/11/2025   The [facility]I Quality Team Specialist reviewed this patient medical record for the purposes of chart review for care gap closure. The following were reviewed: chart review for care gap closure-kidney health evaluation for diabetes:eGFR  and uACR.    [facility]I Quality Team

## 2025-05-23 ENCOUNTER — Ambulatory Visit: Admitting: Family Medicine

## 2025-07-20 ENCOUNTER — Ambulatory Visit
